# Patient Record
Sex: Male | Born: 1975 | Race: White | Hispanic: No | Marital: Married | Smoking: Never smoker
Health system: Southern US, Community
[De-identification: ages and names within clinical notes are randomized; demographics above are authoritative.]

## PROBLEM LIST (undated history)

## (undated) DIAGNOSIS — R51 Headache: Secondary | ICD-10-CM

## (undated) DIAGNOSIS — G8929 Other chronic pain: Secondary | ICD-10-CM

## (undated) DIAGNOSIS — R519 Headache, unspecified: Secondary | ICD-10-CM

## (undated) HISTORY — DX: Headache: R51

## (undated) HISTORY — DX: Headache, unspecified: R51.9

## (undated) HISTORY — DX: Other chronic pain: G89.29

---

## 2018-03-27 DIAGNOSIS — J019 Acute sinusitis, unspecified: Secondary | ICD-10-CM | POA: Diagnosis not present

## 2018-03-27 DIAGNOSIS — J0100 Acute maxillary sinusitis, unspecified: Secondary | ICD-10-CM | POA: Diagnosis not present

## 2018-09-19 ENCOUNTER — Encounter: Payer: Self-pay | Admitting: Family Medicine

## 2018-09-19 ENCOUNTER — Ambulatory Visit (INDEPENDENT_AMBULATORY_CARE_PROVIDER_SITE_OTHER): Payer: BLUE CROSS/BLUE SHIELD

## 2018-09-19 ENCOUNTER — Ambulatory Visit (INDEPENDENT_AMBULATORY_CARE_PROVIDER_SITE_OTHER): Payer: BLUE CROSS/BLUE SHIELD | Admitting: Sports Medicine

## 2018-09-19 ENCOUNTER — Telehealth: Payer: Self-pay

## 2018-09-19 VITALS — BP 120/72 | HR 70 | Ht 73.5 in | Wt 178.8 lb

## 2018-09-19 DIAGNOSIS — M109 Gout, unspecified: Secondary | ICD-10-CM | POA: Insufficient documentation

## 2018-09-19 DIAGNOSIS — M10072 Idiopathic gout, left ankle and foot: Secondary | ICD-10-CM

## 2018-09-19 DIAGNOSIS — M79672 Pain in left foot: Secondary | ICD-10-CM

## 2018-09-19 MED ORDER — METHYLPREDNISOLONE 4 MG PO TBPK: ORAL_TABLET | ORAL | 0 refills | Status: DC

## 2018-09-19 MED ORDER — COLCHICINE 0.6 MG PO CAPS: 1.0000 | ORAL_CAPSULE | Freq: Two times a day (BID) | ORAL | 1 refills | Status: DC | PRN

## 2018-09-19 NOTE — Telephone Encounter (Signed)
Spoke with pharmacy, PA required for Colchicine 0.6 mg capsule. Mitigare brand name is covered but is around $100. Per [place] [facility], may not require PA if sent in for tablet instead of capsule.   PA initiated via [email or web address]  Your information has been submitted to [facility]. [employer] [place] will review the request and fax you a determination directly, typically within 3 business days of your submission once all necessary information is received. If [employer] [place] has not responded in 3 business days or if you have any questions about your submission, contact [employer] at [phone].

## 2018-09-19 NOTE — Assessment & Plan Note (Signed)
Recommend blood work check in 6 weeks

## 2018-09-19 NOTE — Progress Notes (Signed)
Nicholas Harmon. [facility] Harmon [facility] at [facility] [phone]  Nicholas Harmon - 42 y.o. male MRN [id number]  Date of birth: 01/11/1976  Visit Date: 09/19/2018  PCP: Ardith Dark, MD   Referred by: No ref. provider found   Scribe(s) for today's visit: Christoper Fabian, LAT, ATC  SUBJECTIVE:  Nicholas Harmon is here for New Patient (Initial Visit) (L foot pain)    HPI: His L foot pain symptoms INITIALLY: Began last Friday (09/16/18) and feels like it might be related to a 4 mile run on the [place] last Thursday.  Pt reports redness and swelling along the lateral aspect of the L foot and along the dorsal foot.  Pt reports a hx of gout and has only ever had an issue in his R 1st MTP joint. Described as moderate sharp pain w/ weight bearing and dull, aching at rest, radiating to L lower leg Worsened with weight bearing Improved with ice and elevation Additional associated symptoms include: swelling and redness noted in the L lateral and dorsal foot; no N/T noted in L foot; no mechanical symptoms noted in L ankle or foot.    At this time symptoms are worsening compared to onset. He has been taking Tylenol.  REVIEW OF SYSTEMS: Reports night time disturbances. Denies fevers, chills, or night sweats. Denies unexplained weight loss. Denies personal history of cancer. Denies changes in bowel or bladder habits. Denies recent unreported falls. Denies new or worsening dyspnea or wheezing. Reports headaches or dizziness.  Denies numbness, tingling or weakness  In the extremities.  Denies dizziness or presyncopal episodes Reports lower extremity edema - in the L foot   HISTORY:  Prior history reviewed and updated per electronic medical record.  Social History   Occupational History  . Not on file  Tobacco Use  . Smoking status: Never Smoker  . Smokeless tobacco: Never Used  Substance and Sexual Activity  . Alcohol use: Yes  . Drug use: Never  .  Sexual activity: Yes   Social History   Social History Narrative  . Not on file   Past Medical History:  Diagnosis Date  . Chronic headaches    History reviewed. No pertinent surgical history. family history includes Breast cancer in his maternal grandmother; COPD in his paternal grandfather and paternal grandmother; Cancer in his maternal grandmother; Heart disease in his maternal grandfather; Hypertension in his mother; Ovarian cancer in his mother; Prostate cancer in his father. There is no history of Colon cancer.  DATA OBTAINED & REVIEWED:  No results for input(s): HGBA1C, LABURIC, CREATINE, CALCIUM, MG, AST, ALT, CKTOTAL, TSH in the last 8760 hours. Problem  Gout    No specialty comments available.  OBJECTIVE:  VS:  HT:6' 1.5" (186.7 cm)   WT:178 lb 12.8 oz (81.1 kg)  BMI:23.27    BP:120/72  HR:70bpm  TEMP: ( )  RESP:96 %   PHYSICAL EXAM: CONSTITUTIONAL: Well-developed, Well-nourished and In no acute distress EYES: Pupils are equal., EOM intact without nystagmus. and No scleral icterus. Psychiatric: Alert & appropriately interactive. and Not depressed or anxious appearing. EXTREMITY EXAM: Warm and well perfused  Left foot is overall well aligned.  He has no significant deformity but does have some generalized swelling. There is a small amount of erythema but this is minimal. Good midfoot motion. Strength is normal with foot and ankle range of motion  X-rays reviewed show mild degenerative changes but no significant acute abnormality.   ASSESSMENT  1. Left foot pain   2. Acute idiopathic gout of left foot      PROCEDURES:  None  PLAN:  Pertinent additional documentation may be included in corresponding procedure notes, imaging studies, problem based documentation and patient instructions.  Gout Recommend blood work check in 6 weeks  Symptoms are consistent with gout flareup.  Symptomatic treatment with prednisone and colchicine recommended.  Could  consider foot injection but this does seem to be more diffuse.   Activity modifications and the importance of avoiding exacerbating activities (limiting pain to no more than a 4 / 10 during or following activity) recommended and discussed.  Discussed red flag symptoms that warrant earlier emergent evaluation and patient voices understanding.   Meds ordered this encounter  Medications  . DISCONTD: Colchicine (MITIGARE) 0.6 MG CAPS    Sig: Take 1 capsule by mouth 2 (two) times daily as needed.    Dispense:  30 capsule    Refill:  1  . DISCONTD: methylPREDNISolone (MEDROL DOSEPAK) 4 MG TBPK tablet    Sig: Take by mouth as directed. Take 6 tablets on the first day prescribed then as directed.    Dispense:  21 tablet    Refill:  0   Lab Orders  No laboratory test(s) ordered today   Imaging Orders     DG Foot Complete Left Referral Orders  No referral(s) requested today    At follow up will plan to consider: Lab work in 6 weeks to evaluate for uric acid levels due to increased risk of falls negatives at this time  Return if symptoms worsen or fail to improve.     CMA/ATC served as Neurosurgeon during this visit. History, Physical, and Plan performed by medical provider. Documentation and orders reviewed and attested to.      [name], DO    [facility]

## 2018-09-21 MED ORDER — COLCHICINE 0.6 MG PO TABS: ORAL_TABLET | ORAL | 1 refills | Status: DC

## 2018-09-21 NOTE — Telephone Encounter (Signed)
Called pt and left VM to call the office.  

## 2018-09-21 NOTE — Addendum Note (Signed)
Addended by: Dierdre Searles on: 09/21/2018 02:33 PM   Modules accepted: Orders

## 2018-09-21 NOTE — Telephone Encounter (Signed)
PA has been denied. Tried sending in Rx for tablet form of medication. Called pharmacy, advised that tablet form is not covered either. Preferred medication is Colchicine, can use GoodRx card and get prescription for around $66.00.

## 2018-09-30 NOTE — Telephone Encounter (Signed)
Called pt and left VM to call the office.  

## 2018-11-03 ENCOUNTER — Encounter: Payer: Self-pay | Admitting: Family Medicine

## 2018-11-03 ENCOUNTER — Ambulatory Visit (INDEPENDENT_AMBULATORY_CARE_PROVIDER_SITE_OTHER): Payer: BLUE CROSS/BLUE SHIELD | Admitting: Family Medicine

## 2018-11-03 VITALS — BP 122/84 | HR 55 | Temp 97.7°F | Ht 73.5 in | Wt 186.0 lb

## 2018-11-03 DIAGNOSIS — Z23 Encounter for immunization: Secondary | ICD-10-CM

## 2018-11-03 DIAGNOSIS — H6121 Impacted cerumen, right ear: Secondary | ICD-10-CM

## 2018-11-03 DIAGNOSIS — M1000 Idiopathic gout, unspecified site: Secondary | ICD-10-CM | POA: Diagnosis not present

## 2018-11-03 DIAGNOSIS — R51 Headache: Secondary | ICD-10-CM

## 2018-11-03 DIAGNOSIS — R519 Headache, unspecified: Secondary | ICD-10-CM | POA: Insufficient documentation

## 2018-11-03 MED ORDER — SUMATRIPTAN SUCCINATE 50 MG PO TABS: 50.0000 mg | ORAL_TABLET | ORAL | 0 refills | Status: DC | PRN

## 2018-11-03 NOTE — Patient Instructions (Addendum)
It was very nice to see you today!  Please see the attached well-appearing diet plan.  Let me know if you have persistent issues with gout and we can talk about starting you on a controller medication.  I will give a prescription for Imitrex.  Please use this next time have a severe headache and let me know if it improves your symptoms.  You can use Debrox or hydrogen peroxide to help prevent earwax buildup.  Come back to see me 1 in year for your physical, or sooner as needed.   Take care, Dr Jimmey RalphParker  Low-Purine Diet Purines are compounds that affect the level of uric acid in your body. A low-purine diet is a diet that is low in purines. Eating a low-purine diet can prevent the level of uric acid in your body from getting too high and causing gout or kidney stones or both. What do I need to know about this diet?  Choose low-purine foods. Examples of low-purine foods are listed in the next section.  Drink plenty of fluids, especially water. Fluids can help remove uric acid from your body. Try to drink 8-16 cups (1.9-3.8 L) a day.  Limit foods high in fat, especially saturated fat, as fat makes it harder for the body to get rid of uric acid. Foods high in saturated fat include pizza, cheese, ice cream, whole milk, fried foods, and gravies. Choose foods that are lower in fat and lean sources of protein. Use olive oil when cooking as it contains healthy fats that are not high in saturated fat.  Limit alcohol. Alcohol interferes with the elimination of uric acid from your body. If you are having a gout attack, avoid all alcohol.  Keep in mind that different people's bodies react differently to different foods. You will probably learn over time which foods do or do not affect you. If you discover that a food tends to cause your gout to flare up, avoid eating that food. You can more freely enjoy foods that do not cause problems. If you have any questions about a food item, talk to your dietitian or  health care provider. Which foods are low, moderate, and high in purines? The following is a list of foods that are low, moderate, and high in purines. You can eat any amount of the foods that are low in purines. You may be able to have small amounts of foods that are moderate in purines. Ask your health care provider how much of a food moderate in purines you can have. Avoid foods high in purines. Grains  Foods low in purines: Enriched white bread, pasta, rice, cake, cornbread, popcorn.  Foods moderate in purines: Whole-grain breads and cereals, wheat germ, bran, oatmeal. Uncooked oatmeal. Dry wheat bran or wheat germ.  Foods high in purines: Pancakes, [place] toast, biscuits, muffins. Vegetables  Foods low in purines: All vegetables, except those that are moderate in purines.  Foods moderate in purines: Asparagus, cauliflower, spinach, mushrooms, green peas. Fruits  All fruits are low in purines. Meats and other Protein Foods  Foods low in purines: Eggs, nuts, peanut butter.  Foods moderate in purines: 80-90% lean beef, lamb, veal, pork, poultry, fish, eggs, peanut butter, nuts. Crab, lobster, oysters, and shrimp. Cooked dried beans, peas, and lentils.  Foods high in purines: Anchovies, sardines, herring, mussels, tuna, codfish, scallops, trout, and haddock. Tomasa BlaseBacon. Organ meats (such as liver or kidney). Tripe. Game meat. Goose. Sweetbreads. Dairy  All dairy foods are low in purines. Low-fat and  fat-free dairy products are best because they are low in saturated fat. Beverages  Drinks low in purines: Water, carbonated beverages, tea, coffee, cocoa.  Drinks moderate in purines: Soft drinks and other drinks sweetened with high-fructose corn syrup. Juices. To find whether a food or drink is sweetened with high-fructose corn syrup, look at the ingredients list.  Drinks high in purines: Alcoholic beverages (such as beer). Condiments  Foods low in purines: Salt, herbs, olives, pickles,  relishes, vinegar.  Foods moderate in purines: Butter, margarine, oils, mayonnaise. Fats and Oils  Foods low in purines: All types, except gravies and sauces made with meat.  Foods high in purines: Gravies and sauces made with meat. Other Foods  Foods low in purines: Sugars, sweets, gelatin. Cake. Soups made without meat.  Foods moderate in purines: Meat-based or fish-based soups, broths, or bouillons. Foods and drinks sweetened with high-fructose corn syrup.  Foods high in purines: High-fat desserts (such as ice cream, cookies, cakes, pies, doughnuts, and chocolate). Contact your dietitian for more information on foods that are not listed here. This information is not intended to replace advice given to you by your health care provider. Make sure you discuss any questions you have with your health care provider. Document Released: 03/06/2011 Document Revised: 04/16/2016 Document Reviewed: 10/16/2013 Elsevier Interactive Patient Education  2017 [employer].

## 2018-11-03 NOTE — Assessment & Plan Note (Signed)
Description of headache seem to be possibly consistent with migraine type headaches.  No red flags today.  We will send in a prescription for Imitrex to use as needed.  If he has good response to Imitrex, would consider starting daily migraine prophylactic medication.  Discussed reasons to return to care and seek emergent care.

## 2018-11-03 NOTE — Progress Notes (Signed)
Subjective:  Nicholas Harmon is a 42 y.o. male who presents today with a chief complaint of gout and to establish care.   HPI:  Gout, chronic problem, new to provider Several year history. Recently had a flare about 6 weeks ago was seen by sports medicine physician.  He was started with Medrol Dosepak with significant provement of symptoms.  Still has a little lingering pain in his feet bilaterally.  Otherwise symptoms are stable.  He has had 2 or 3 other flares in the past.  No other specific treatments tried.  He is not sure of any specific dietary triggers.  Aggravating factors.  Chronic daily headache, new problem to provider Several year history.  Gets multiple headaches every week.  Occasionally will have several days in row headaches.  Sometimes not a continuous headache for several days in row.  Takes "a lot" of Excedrin Migraine.  Sometimes takes up to 6 tablets daily.  Headache is described as a pounding, throbbing sensation that starts on either side of his forehead and then sometimes radiates to the other side.  No associated photophobia.  No associated visual changes.  No weakness or numbness.  Symptoms have been stable for several years.  Occasionally gets nausea.  Cerumen impaction Patient gets frequent cerumen impactions in bilateral ears.  Uses hydrogen peroxide and irrigation at home.  ROS: Per HPI, otherwise a complete review of systems was negative.   PMH:  The following were reviewed and entered/updated in epic: Past Medical History:  Diagnosis Date  . Chronic headaches    Patient Active Problem List   Diagnosis Date Noted  . Chronic nonintractable headache 11/03/2018  . Gout 09/19/2018   History reviewed. No pertinent surgical history.  Family History  Problem Relation Age of Onset  . Hypertension Mother   . Ovarian cancer Mother   . Breast cancer Maternal Grandmother   . Cancer Maternal Grandmother   . Prostate cancer Father   . Heart disease Maternal  Grandfather   . COPD Paternal Grandmother   . COPD Paternal Grandfather   . Colon cancer Neg Hx     Medications- reviewed and updated Current Outpatient Medications  Medication Sig Dispense Refill  . aspirin-acetaminophen-caffeine (EXCEDRIN MIGRAINE) 250-250-65 MG tablet Take by mouth every 6 (six) hours as needed for headache.    . SUMAtriptan (IMITREX) 50 MG tablet Take 1 tablet (50 mg total) by mouth as needed for up to 1 dose for migraine (do not exceed 200mg  in 24 hour period). May repeat in 2 hours if headache persists or recurs. 30 tablet 0   No current facility-administered medications for this visit.     Allergies-reviewed and updated No Known Allergies  Social History   Socioeconomic History  . Marital status: Married    Spouse name: Not on file  . Number of children: Not on file  . Years of education: Not on file  . Highest education level: Not on file  Occupational History  . Not on file  Social Needs  . Financial resource strain: Not on file  . Food insecurity:    Worry: Not on file    Inability: Not on file  . Transportation needs:    Medical: Not on file    Non-medical: Not on file  Tobacco Use  . Smoking status: Never Smoker  . Smokeless tobacco: Never Used  Substance and Sexual Activity  . Alcohol use: Yes  . Drug use: Never  . Sexual activity: Yes  Lifestyle  .  Physical activity:    Days per week: Not on file    Minutes per session: Not on file  . Stress: Not on file  Relationships  . Social connections:    Talks on phone: Not on file    Gets together: Not on file    Attends religious service: Not on file    Active member of club or organization: Not on file    Attends meetings of clubs or organizations: Not on file    Relationship status: Not on file  Other Topics Concern  . Not on file  Social History Narrative  . Not on file     Objective:  Physical Exam: BP 122/84 (BP Location: Left Arm, Patient Position: Sitting, Cuff Size:  Normal)   Pulse (!) 55   Temp 97.7 F (36.5 C) (Oral)   Ht 6' 1.5" (1.867 m)   Wt 186 lb (84.4 kg)   SpO2 99%   BMI 24.21 kg/m   Gen: NAD, resting comfortably HEENT: Right EAC with impacted cerumen.  Left EAC clear.  Left TM clear. CV: RRR with no murmurs appreciated Pulm: NWOB, CTAB with no crackles, wheezes, or rhonchi GI: Normal bowel sounds present. Soft, Nontender, Nondistended. MSK: No edema, cyanosis, or clubbing noted Skin: Warm, dry Neuro: Cranial nerves III through XII intact.  Strength 5 out of 5 in upper and lower extremities.  Sensation light touch intact throughout grossly.   Psych: Normal affect and thought content  Assessment/Plan:  Gout Flare seems to be resolving.  Discussed dietary modifications and low purine diet.  Given that he only very infrequently has flares, do not think he needs to start allopurinol today.  He will follow-up in several months for his annual physical.  Will check uric acid with his blood draw.  Chronic nonintractable headache Description of headache seem to be possibly consistent with migraine type headaches.  No red flags today.  We will send in a prescription for Imitrex to use as needed.  If he has good response to Imitrex, would consider starting daily migraine prophylactic medication.  Discussed reasons to return to care and seek emergent care.  Cerumen Impaction Patient declined irrigation in office today.  Recommended use of Debrox or hydrogen peroxide as needed.  Preventive health care Tdap and flu vaccine given today.  He will follow-up in about a year for his annual physical with blood work.  Katina Degree. Jimmey Ralph, MD 11/03/2018 10:45 AM

## 2018-11-03 NOTE — Assessment & Plan Note (Signed)
Flare seems to be resolving.  Discussed dietary modifications and low purine diet.  Given that he only very infrequently has flares, do not think he needs to start allopurinol today.  He will follow-up in several months for his annual physical.  Will check uric acid with his blood draw.

## 2019-04-17 IMAGING — DX DG FOOT COMPLETE 3+V*L*
3 series · 3 of 3 positions shown · non-contrast
Comparison: None.

CLINICAL DATA: Left foot pain and redness over the 3rd to 5th
metatarsals 3 days. Recent 4 mile run.

EXAM:
LEFT FOOT - COMPLETE 3+ VIEW

[foot dp]
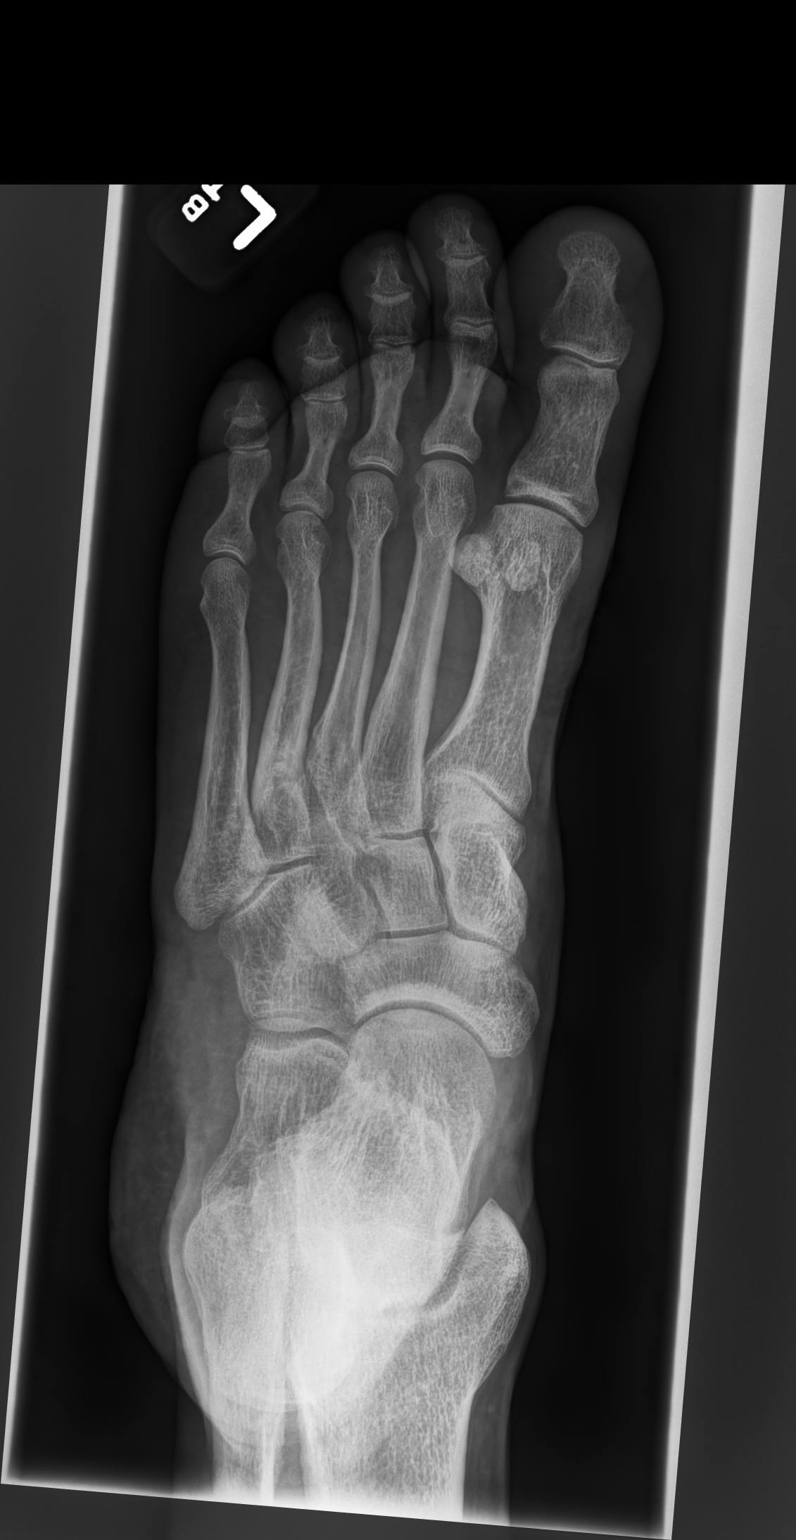

[foot oblique]
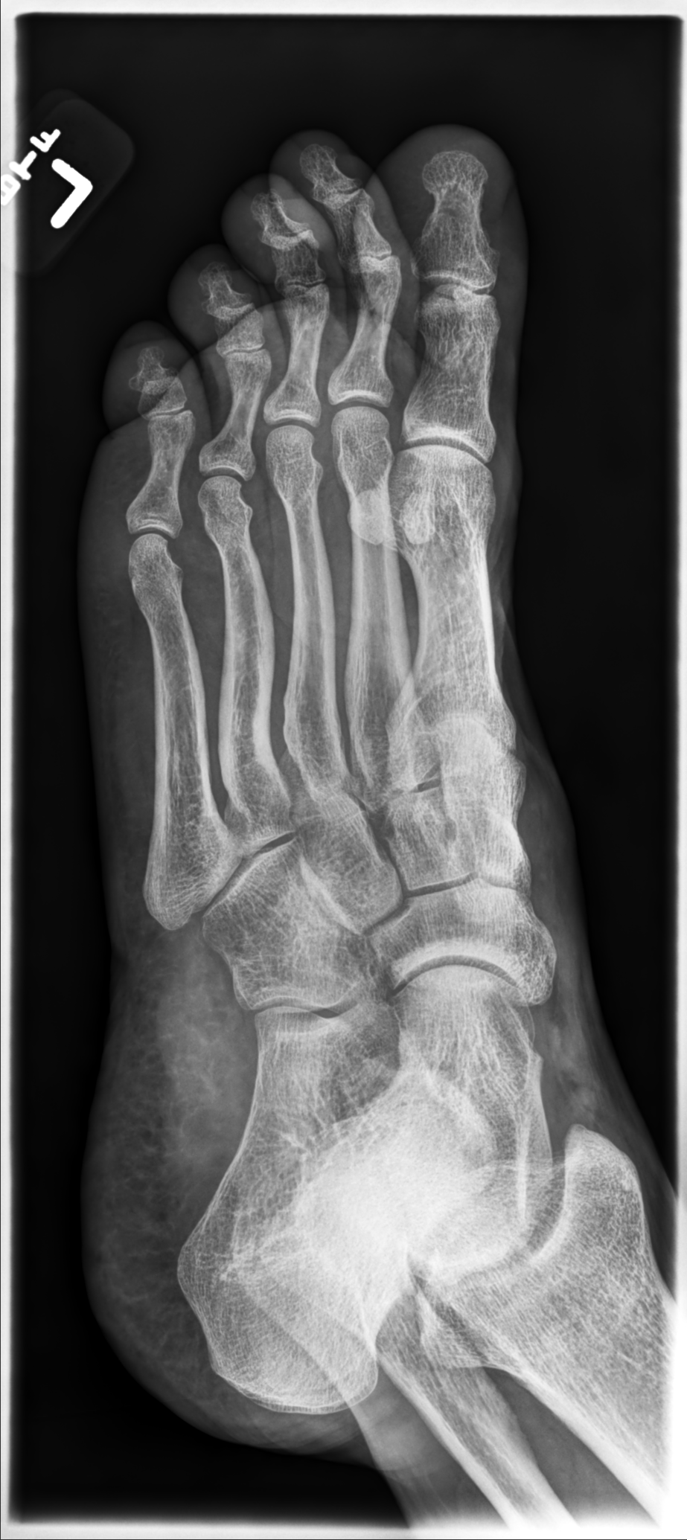

[foot lat]
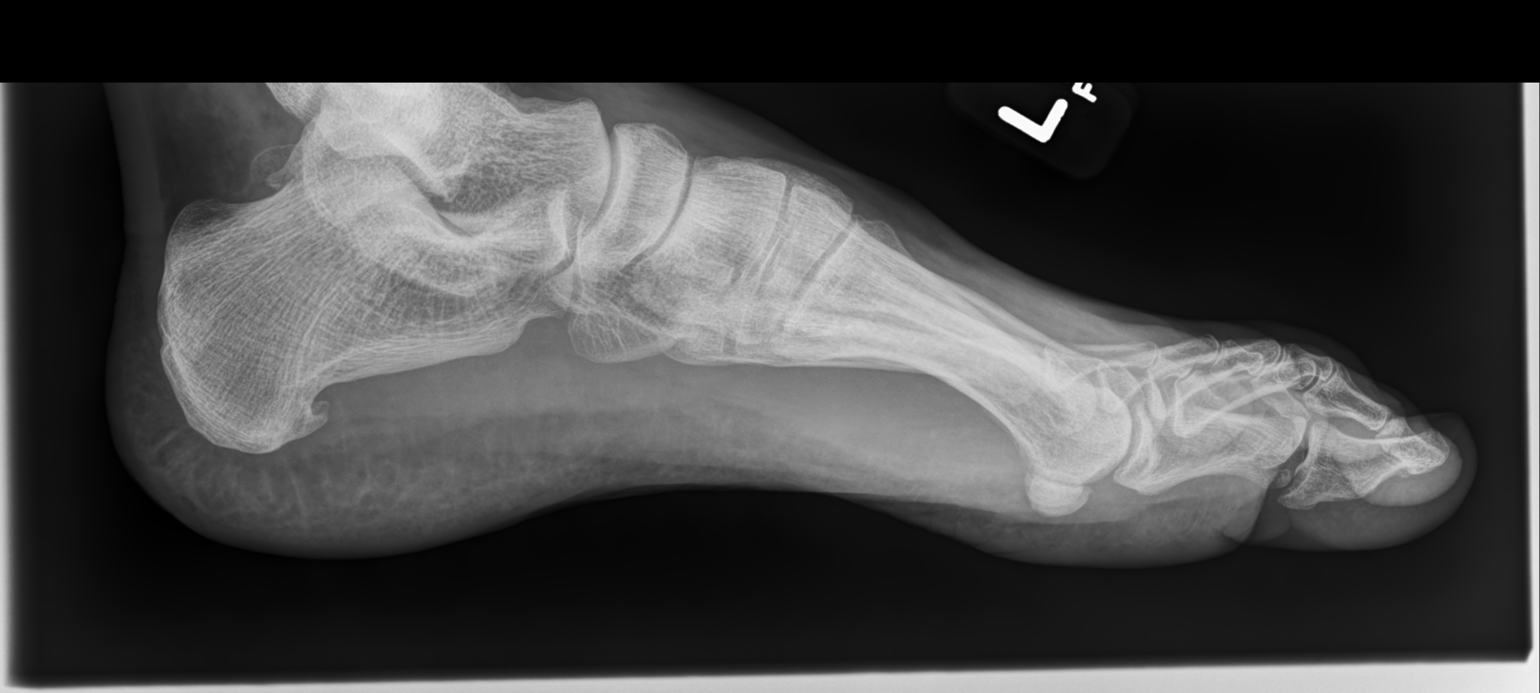

[3 of 3 positions shown; findings below may reference images not displayed]

FINDINGS: No acute fracture or dislocation. No significant degenerative
changes. No air within the soft tissues. Minimal spurring of the
inferior calcaneus.
IMPRESSION: No acute findings.

## 2019-05-08 DIAGNOSIS — Z03818 Encounter for observation for suspected exposure to other biological agents ruled out: Secondary | ICD-10-CM | POA: Diagnosis not present

## 2019-11-08 DIAGNOSIS — Z7189 Other specified counseling: Secondary | ICD-10-CM | POA: Diagnosis not present

## 2019-11-08 DIAGNOSIS — Z20828 Contact with and (suspected) exposure to other viral communicable diseases: Secondary | ICD-10-CM | POA: Diagnosis not present

## 2019-11-12 DIAGNOSIS — Z03818 Encounter for observation for suspected exposure to other biological agents ruled out: Secondary | ICD-10-CM | POA: Diagnosis not present

## 2020-01-10 ENCOUNTER — Ambulatory Visit (INDEPENDENT_AMBULATORY_CARE_PROVIDER_SITE_OTHER): Payer: Self-pay | Admitting: Physician Assistant

## 2020-01-10 ENCOUNTER — Encounter: Payer: Self-pay | Admitting: Physician Assistant

## 2020-01-10 VITALS — Ht 73.5 in | Wt 183.0 lb

## 2020-01-10 DIAGNOSIS — J029 Acute pharyngitis, unspecified: Secondary | ICD-10-CM

## 2020-01-10 NOTE — Progress Notes (Signed)
Virtual Visit via Video   I connected with Nicholas Harmon on 01/10/20 at  3:40 PM EST by a video enabled telemedicine application and verified that I am speaking with the correct person using two identifiers. Location patient: Home Location provider: [facility], Office Persons participating in the virtual visit: Nedim Bradin, [place] PA-C, Corky Mull, LPN   I discussed the limitations of evaluation and management by telemedicine and the availability of in person appointments. The patient expressed understanding and agreed to proceed.  I acted as a Neurosurgeon for [employer], PA-C [employer], LPN  Subjective:   HPI:   Patient is requesting evaluation for possible COVID-19.  Symptom onset: Monday  Travel/contacts: no known exposure  Patient endorses the following symptoms: sinus headache, sore throat and difficulty swallowing   Feels like he has red, swollen sore throat. Throat pain is worsening with time.  Patient denies the following symptoms: subjective fever, sinus congestion, itchy watery eyes, ear pain, wheezing, shortness of breath, chest tightness, chest pain and myalgia, loss of taste/smell  Treatments tried: Excedrin  Patient risk factors: Current COVID-19 risk of complications score: 1 Smoking status: Nicholas Harmon  reports that he has never smoked. He has never used smokeless tobacco. If male, currently pregnant? []   Yes []   No  ROS: See pertinent positives and negatives per HPI.  Patient Active Problem List   Diagnosis Date Noted  . Chronic nonintractable headache 11/03/2018  . Gout 09/19/2018    Social History   Tobacco Use  . Smoking status: Never Smoker  . Smokeless tobacco: Never Used  Substance Use Topics  . Alcohol use: Yes    Current Outpatient Medications:  .  aspirin-acetaminophen-caffeine (EXCEDRIN MIGRAINE) 250-250-65 MG tablet, Take by mouth every 6 (six) hours as needed for headache., Disp: , Rfl:  .  Multiple  Vitamin (MULTIVITAMIN) tablet, Take 1 tablet by mouth daily., Disp: , Rfl:   No Known Allergies  Objective:   VITALS: Per patient if applicable, see vitals. GENERAL: Alert, appears well and in no acute distress. HEENT: Atraumatic, conjunctiva clear, no obvious abnormalities on inspection of external nose and ears. NECK: Normal movements of the head and neck. CARDIOPULMONARY: No increased WOB. Speaking in clear sentences. I:E ratio WNL.  MS: Moves all visible extremities without noticeable abnormality. PSYCH: Pleasant and cooperative, well-groomed. Speech normal rate and rhythm. Affect is appropriate. Insight and judgement are appropriate. Attention is focused, linear, and appropriate.  NEURO: CN grossly intact. Oriented as arrived to appointment on time with no prompting. Moves both UE equally.  SKIN: No obvious lesions, wounds, erythema, or cyanosis noted on face or hands.  Assessment and Plan:   Nicholas Harmon was seen today for covid symptoms.  Diagnoses and all orders for this visit:  Pharyngitis, unspecified etiology   Patient has a respiratory illness without signs of acute distress or respiratory compromise at this time.   Unable to test for strep, however we will empirically treat for strep as his sore throat is worsening with time. Will treat with oral amoxicillin per orders. Recommended close follow-up if no improvement or any worsening of symptoms.  After discussion, we opted to not test for COVID-19 at this time. Reviewed worsening precautions as well as other signs/symptoms of COVID-19, recommended testing if he does develop new symptoms. Patient is agreeable to plan. No red flags on discussion.  . Reviewed expectations re: course of current medical issues. . Discussed self-management of symptoms. . Outlined signs and symptoms indicating need  for more acute intervention. . Patient verbalized understanding and all questions were answered. Marland Kitchen Health Maintenance issues including  appropriate healthy diet, exercise, and smoking avoidance were discussed with patient. . See orders for this visit as documented in the electronic medical record.  I discussed the assessment and treatment plan with the patient. The patient was provided an opportunity to ask questions and all were answered. The patient agreed with the plan and demonstrated an understanding of the instructions.   The patient was advised to call back or seek an in-person evaluation if the symptoms worsen or if the condition fails to improve as anticipated.   CMA or LPN served as scribe during this visit. History, Physical, and Plan performed by medical provider. The above documentation has been reviewed and is accurate and complete.   [place], [place] 01/10/2020

## 2020-01-11 ENCOUNTER — Other Ambulatory Visit: Payer: Self-pay | Admitting: Internal Medicine

## 2020-01-11 ENCOUNTER — Other Ambulatory Visit: Payer: Self-pay | Admitting: Physician Assistant

## 2020-01-11 MED ORDER — AMOXICILLIN 500 MG PO CAPS: 500.0000 mg | ORAL_CAPSULE | Freq: Two times a day (BID) | ORAL | 0 refills | Status: AC

## 2020-01-11 MED ORDER — AMOXICILLIN 500 MG PO CAPS: 500.0000 mg | ORAL_CAPSULE | Freq: Two times a day (BID) | ORAL | 0 refills | Status: DC

## 2020-02-16 ENCOUNTER — Ambulatory Visit: Payer: BLUE CROSS/BLUE SHIELD | Attending: Internal Medicine

## 2020-02-16 DIAGNOSIS — Z23 Encounter for immunization: Secondary | ICD-10-CM

## 2020-03-11 ENCOUNTER — Ambulatory Visit: Payer: BLUE CROSS/BLUE SHIELD | Attending: Internal Medicine

## 2020-03-11 DIAGNOSIS — Z23 Encounter for immunization: Secondary | ICD-10-CM

## 2020-03-11 NOTE — Progress Notes (Signed)
   Covid-19 Vaccination Clinic  Name:  Nicholas Harmon    MRN: [id number] DOB: 10/29/1976  03/11/2020  [name] was observed post Covid-19 immunization for 15 minutes without incident. He was provided with Vaccine Information Sheet and instruction to access the V-Safe system.   [name] was instructed to call 911 with any severe reactions post vaccine: Marland Kitchen Difficulty breathing  . Swelling of face and throat  . A fast heartbeat  . A bad rash all over body  . Dizziness and weakness   Immunizations Administered    Name Date Dose VIS Date Route   Pfizer COVID-19 Vaccine 03/11/2020  2:04 PM 0.3 mL 01/17/2019 Intramuscular   Manufacturer: [employer]   Lot: [id number]   NDC: [address]-0

## 2020-07-22 ENCOUNTER — Telehealth: Payer: BLUE CROSS/BLUE SHIELD | Admitting: Emergency Medicine

## 2020-07-22 DIAGNOSIS — J0191 Acute recurrent sinusitis, unspecified: Secondary | ICD-10-CM

## 2020-07-22 MED ORDER — METHYLPREDNISOLONE 4 MG PO TBPK: ORAL_TABLET | ORAL | 0 refills | Status: DC

## 2020-07-22 MED ORDER — AMOXICILLIN-POT CLAVULANATE 875-125 MG PO TABS: 1.0000 | ORAL_TABLET | Freq: Two times a day (BID) | ORAL | 0 refills | Status: DC

## 2020-07-22 NOTE — Progress Notes (Signed)
We are sorry that you are not feeling well.  Here is how we plan to help!  Based on what you have shared with me it looks like you have sinusitis.  Sinusitis is inflammation and infection in the sinus cavities of the head.  Based on your presentation I believe you most likely have Acute Bacterial Sinusitis.  This is an infection caused by bacteria and is treated with antibiotics. I have prescribed Augmentin 875mg/125mg one tablet twice daily with food, for 7 days. You may use an oral decongestant such as Mucinex D or if you have glaucoma or high blood pressure use plain Mucinex. Saline nasal spray help and can safely be used as often as needed for congestion.  If you develop worsening sinus pain, fever or notice severe headache and vision changes, or if symptoms are not better after completion of antibiotic, please schedule an appointment with a health care provider.    Sinus infections are not as easily transmitted as other respiratory infection, however we still recommend that you avoid close contact with loved ones, especially the very young and elderly.  Remember to wash your hands thoroughly throughout the day as this is the number one way to prevent the spread of infection!  Home Care:  Only take medications as instructed by your medical team.  Complete the entire course of an antibiotic.  Do not take these medications with alcohol.  A steam or ultrasonic humidifier can help congestion.  You can place a towel over your head and breathe in the steam from hot water coming from a faucet.  Avoid close contacts especially the very young and the elderly.  Cover your mouth when you cough or sneeze.  Always remember to wash your hands.  Get Help Right Away If:  You develop worsening fever or sinus pain.  You develop a severe head ache or visual changes.  Your symptoms persist after you have completed your treatment plan.  Make sure you  Understand these instructions.  Will watch your  condition.  Will get help right away if you are not doing well or get worse.  Your e-visit answers were reviewed by a board certified advanced clinical practitioner to complete your personal care plan.  Depending on the condition, your plan could have included both over the counter or prescription medications.  If there is a problem please reply  once you have received a response from your provider.  Your safety is important to us.  If you have drug allergies check your prescription carefully.    You can use MyChart to ask questions about today's visit, request a non-urgent call back, or ask for a work or school excuse for 24 hours related to this e-Visit. If it has been greater than 24 hours you will need to follow up with your provider, or enter a new e-Visit to address those concerns.  You will get an e-mail in the next two days asking about your experience.  I hope that your e-visit has been valuable and will speed your recovery. Thank you for using e-visits.   **Please do not respond to this message unless you have follow up questions.** Greater than 5 but less than 10 minutes spent researching, coordinating, and implementing care for this patient today  

## 2020-07-22 NOTE — Addendum Note (Signed)
Addended by: Arthor Captain on: 07/22/2020 10:37 AM   Modules accepted: Orders

## 2020-11-14 DIAGNOSIS — Z03818 Encounter for observation for suspected exposure to other biological agents ruled out: Secondary | ICD-10-CM | POA: Diagnosis not present

## 2021-08-14 ENCOUNTER — Ambulatory Visit: Payer: BLUE CROSS/BLUE SHIELD | Admitting: Family Medicine

## 2021-08-15 ENCOUNTER — Other Ambulatory Visit: Payer: Self-pay

## 2021-08-15 ENCOUNTER — Ambulatory Visit (INDEPENDENT_AMBULATORY_CARE_PROVIDER_SITE_OTHER): Payer: BLUE CROSS/BLUE SHIELD | Admitting: Physician Assistant

## 2021-08-15 VITALS — BP 128/82 | HR 65 | Temp 98.4°F | Ht 73.5 in | Wt 191.2 lb

## 2021-08-15 DIAGNOSIS — G44229 Chronic tension-type headache, not intractable: Secondary | ICD-10-CM

## 2021-08-15 MED ORDER — NAPROXEN 500 MG PO TABS: ORAL_TABLET | ORAL | 0 refills | Status: DC

## 2021-08-15 MED ORDER — SUMATRIPTAN SUCCINATE 50 MG PO TABS: 50.0000 mg | ORAL_TABLET | ORAL | 0 refills | Status: DC | PRN

## 2021-08-15 NOTE — Patient Instructions (Addendum)
Please try to keep a "headache diary" and bring to next appointment with Dr. Jimmey Ralph.  We need to try to get you off the Excedrin daily..... You may take Imitrex & Naproxen together for acute migraine.  For worst headache of life, go straight to ED.  Call sooner if any concerns.

## 2021-08-15 NOTE — Progress Notes (Signed)
Subjective:    Patient ID: Nicholas Harmon, male    DOB: [birth year], 45 y.o.   MRN: [id number]  Chief Complaint  Patient presents with   Headache    HPI Patient is in today for headache discussion.  He states that he has a history of chronic headaches and has been taking Excedrin migraine almost daily for years.  Every once in a while usually every other month or so, he states that he has a headache that last for several days and will go away and the Excedrin does not help with it.  This week he had a headache similar to that, but it resolved as of yesterday.  He states that the pain is diffuse around his head and behind his eyes when he does have the pain.  He does not usually have light sensitivity or nausea or vomiting.  He denies any new neurologic symptoms.  He says he was prescribed Imitrex at one point, but never took this medication.  Past Medical History:  Diagnosis Date   Chronic headaches     No past surgical history on file.  Family History  Problem Relation Age of Onset   Hypertension Mother    Ovarian cancer Mother    Breast cancer Maternal Grandmother    Cancer Maternal Grandmother    Prostate cancer Father    Heart disease Maternal Grandfather    COPD Paternal Grandmother    COPD Paternal Grandfather    Colon cancer Neg Hx     Social History   Tobacco Use   Smoking status: Never   Smokeless tobacco: Never  Vaping Use   Vaping Use: Never used  Substance Use Topics   Alcohol use: Yes   Drug use: Never     No Known Allergies  Review of Systems REFER TO HPI FOR PERTINENT POSITIVES AND NEGATIVES      Objective:     BP 128/82   Pulse 65   Temp 98.4 F (36.9 C)   Ht 6' 1.5" (1.867 m)   Wt 191 lb 3.2 oz (86.7 kg)   SpO2 98%   BMI 24.88 kg/m   Wt Readings from Last 3 Encounters:  08/15/21 191 lb 3.2 oz (86.7 kg)  01/10/20 183 lb (83 kg)  11/03/18 186 lb (84.4 kg)    BP Readings from Last 3 Encounters:  08/15/21 128/82  11/03/18 122/84   09/19/18 120/72     Physical Exam Vitals and nursing note reviewed.  Constitutional:      General: He is not in acute distress.    Appearance: Normal appearance. He is not toxic-appearing.  HENT:     Head: Normocephalic and atraumatic.     Right Ear: Tympanic membrane, ear canal and external ear normal.     Left Ear: Tympanic membrane, ear canal and external ear normal.     Nose: Nose normal.     Mouth/Throat:     Mouth: Mucous membranes are moist.     Pharynx: Oropharynx is clear.  Eyes:     Extraocular Movements: Extraocular movements intact.     Conjunctiva/sclera: Conjunctivae normal.     Pupils: Pupils are equal, round, and reactive to light.  Cardiovascular:     Rate and Rhythm: Normal rate and regular rhythm.     Pulses: Normal pulses.     Heart sounds: Normal heart sounds.  Pulmonary:     Effort: Pulmonary effort is normal.     Breath sounds: Normal breath sounds.  Abdominal:  General: Abdomen is flat.  Musculoskeletal:        General: Normal range of motion.     Cervical back: Normal range of motion and neck supple.  Skin:    General: Skin is warm and dry.  Neurological:     General: No focal deficit present.     Mental Status: He is alert and oriented to person, place, and time.     Cranial Nerves: No cranial nerve deficit.     Sensory: No sensory deficit.     Motor: No weakness.     Coordination: Coordination normal.     Gait: Gait normal.  Psychiatric:        Mood and Affect: Mood normal.        Behavior: Behavior normal.       Assessment & Plan:   Problem List Items Addressed This Visit       Other   Chronic nonintractable headache - Primary   Relevant Medications   naproxen (NAPROSYN) 500 MG tablet   SUMAtriptan (IMITREX) 50 MG tablet     Meds ordered this encounter  Medications   naproxen (NAPROSYN) 500 MG tablet    Sig: Take one tablet as needed with Imitrex for acute migraine.    Dispense:  30 tablet    Refill:  0   SUMAtriptan  (IMITREX) 50 MG tablet    Sig: Take 1 tablet (50 mg total) by mouth every 2 (two) hours as needed for migraine. May repeat in 2 hours if headache persists or recurs.    Dispense:  10 tablet    Refill:  0   1. Chronic tension-type headache, not intractable -Chronic use of Excedrin Migraine most likely causing daily rebound headaches. -Needs to come up with a plan with Dr. Jimmey Ralph PCP on how to get off of this medication altogether -Advised for him to keep a headache diary over the next month so we can get a better idea of what he is experiencing. -Prescription for Imitrex and naproxen to take 1 tablet together for acute migraine, side effects discussed -He understands that he needs to go to the emergency department for any worst headache of life or other acute change in symptoms. -No red flags on exam indicating any need for imaging of head today  This note was prepared with assistance of Dragon voice recognition software. Occasional wrong-word or sound-a-like substitutions may have occurred due to the inherent limitations of voice recognition software.  Time Spent: 23 minutes of total time was spent on the date of the encounter performing the following actions: chart review prior to seeing the patient, obtaining history, performing a medically necessary exam, counseling on the treatment plan, placing orders, and documenting in our EHR.    Camar Guyton M Harlem Thresher, PA-C

## 2021-09-15 ENCOUNTER — Other Ambulatory Visit: Payer: Self-pay

## 2021-09-15 ENCOUNTER — Ambulatory Visit (INDEPENDENT_AMBULATORY_CARE_PROVIDER_SITE_OTHER): Payer: BLUE CROSS/BLUE SHIELD | Admitting: Family Medicine

## 2021-09-15 ENCOUNTER — Encounter: Payer: Self-pay | Admitting: Family Medicine

## 2021-09-15 VITALS — BP 118/79 | HR 54 | Temp 98.0°F | Ht 73.5 in | Wt 191.6 lb

## 2021-09-15 DIAGNOSIS — Z1211 Encounter for screening for malignant neoplasm of colon: Secondary | ICD-10-CM

## 2021-09-15 DIAGNOSIS — R519 Headache, unspecified: Secondary | ICD-10-CM | POA: Diagnosis not present

## 2021-09-15 DIAGNOSIS — Z0001 Encounter for general adult medical examination with abnormal findings: Secondary | ICD-10-CM

## 2021-09-15 DIAGNOSIS — G8929 Other chronic pain: Secondary | ICD-10-CM | POA: Diagnosis not present

## 2021-09-15 DIAGNOSIS — Z13220 Encounter for screening for lipoid disorders: Secondary | ICD-10-CM | POA: Diagnosis not present

## 2021-09-15 DIAGNOSIS — Z23 Encounter for immunization: Secondary | ICD-10-CM | POA: Diagnosis not present

## 2021-09-15 NOTE — Patient Instructions (Signed)
It was very nice to see you today!  Please let Nicholas Harmon know if you would like to see a headache specialist or start a preventative medication.  Please keep up the good work on diet and exercise.  We will check blood work today and set you up for cologuard.  We will see you back in year for your next physical.  Please come back sooner if needed.  Take care, Dr Jimmey Ralph  PLEASE NOTE:  If you had any lab tests please let Nicholas Harmon know if you have not heard back within a few days. You may see your results on mychart before we have a chance to review them but we will give you a call once they are reviewed by Nicholas Harmon. If we ordered any referrals today, please let Nicholas Harmon know if you have not heard from their office within the next week.   Please try these tips to maintain a healthy lifestyle:  Eat at least 3 REAL meals and 1-2 snacks per day.  Aim for no more than 5 hours between eating.  If you eat breakfast, please do so within one hour of getting up.   Each meal should contain half fruits/vegetables, one quarter protein, and one quarter carbs (no bigger than a computer mouse)  Cut down on sweet beverages. This includes juice, soda, and sweet tea.   Drink at least 1 glass of water with each meal and aim for at least 8 glasses per day  Exercise at least 150 minutes every week.    Preventive Care 46-21 Years Old, Male Preventive care refers to lifestyle choices and visits with your health care provider that can promote health and wellness. This includes: A yearly physical exam. This is also called an annual wellness visit. Regular dental and eye exams. Immunizations. Screening for certain conditions. Healthy lifestyle choices, such as: Eating a healthy diet. Getting regular exercise. Not using drugs or products that contain nicotine and tobacco. Limiting alcohol use. What can I expect for my preventive care visit? Physical exam Your health care provider will check your: Height and weight. These may be  used to calculate your BMI (body mass index). BMI is a measurement that tells if you are at a healthy weight. Heart rate and blood pressure. Body temperature. Skin for abnormal spots. Counseling Your health care provider may ask you questions about your: Past medical problems. Family's medical history. Alcohol, tobacco, and drug use. Emotional well-being. Home life and relationship well-being. Sexual activity. Diet, exercise, and sleep habits. Work and work Astronomer. Access to firearms. What immunizations do I need? Vaccines are usually given at various ages, according to a schedule. Your health care provider will recommend vaccines for you based on your age, medical history, and lifestyle or other factors, such as travel or where you work. What tests do I need? Blood tests Lipid and cholesterol levels. These may be checked every 5 years, or more often if you are over 62 years old. Hepatitis C test. Hepatitis B test. Screening Lung cancer screening. You may have this screening every year starting at age 57 if you have a 30-pack-year history of smoking and currently smoke or have quit within the past 15 years. Prostate cancer screening. Recommendations will vary depending on your family history and other risks. Genital exam to check for testicular cancer or hernias. Colorectal cancer screening. All adults should have this screening starting at age 60 and continuing until age 29. Your health care provider may recommend screening at age 74 if you  are at increased risk. You will have tests every 1-10 years, depending on your results and the type of screening test. Diabetes screening. This is done by checking your blood sugar (glucose) after you have not eaten for a while (fasting). You may have this done every 1-3 years. STD (sexually transmitted disease) testing, if you are at risk. Follow these instructions at home: Eating and drinking  Eat a diet that includes fresh fruits and  vegetables, whole grains, lean protein, and low-fat dairy products. Take vitamin and mineral supplements as recommended by your health care provider. Do not drink alcohol if your health care provider tells you not to drink. If you drink alcohol: Limit how much you have to 0-2 drinks a day. Be aware of how much alcohol is in your drink. In the [place] one drink equals one 12 oz bottle of beer (355 mL), one 5 oz glass of wine (148 mL), or one 1 oz glass of hard liquor (44 mL). Lifestyle Take daily care of your teeth and gums. Brush your teeth every morning and night with fluoride toothpaste. Floss one time each day. Stay active. Exercise for at least 30 minutes 5 or more days each week. Do not use any products that contain nicotine or tobacco, such as cigarettes, e-cigarettes, and chewing tobacco. If you need help quitting, ask your health care provider. Do not use drugs. If you are sexually active, practice safe sex. Use a condom or other form of protection to prevent STIs (sexually transmitted infections). If told by your health care provider, take low-dose aspirin daily starting at age 52. Find healthy ways to cope with stress, such as: Meditation, yoga, or listening to music. Journaling. Talking to a trusted person. Spending time with friends and family. Safety Always wear your seat belt while driving or riding in a vehicle. Do not drive: If you have been drinking alcohol. Do not ride with someone who has been drinking. When you are tired or distracted. While texting. Wear a helmet and other protective equipment during sports activities. If you have firearms in your house, make sure you follow all gun safety procedures. What's next? Go to your health care provider once a year for an annual wellness visit. Ask your health care provider how often you should have your eyes and teeth checked. Stay up to date on all vaccines. This information is not intended to replace advice given to you  by your health care provider. Make sure you discuss any questions you have with your health care provider. Document Revised: 01/17/2021 Document Reviewed: 11/03/2018 Elsevier Patient Education  2022 [employer].

## 2021-09-15 NOTE — Progress Notes (Signed)
Chief Complaint:  Nicholas Harmon is a 45 y.o. male who presents today for his annual comprehensive physical exam.    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  He is trying to wean down on his use of Excedrin and caffeine.  Discussed importance of limiting as needed medications to once or twice weekly.  Discussed referral to headache clinic however he declined.  He deferred starting preventive medication.  He will let me know if he changes his mind on either of this things.   Preventative Healthcare: Flu shot given today. Will order cologuard. Check labs.   Patient Counseling(The following topics were reviewed and/or handout was given):  -Nutrition: Stressed importance of moderation in sodium/caffeine intake, saturated fat and cholesterol, caloric balance, sufficient intake of fresh fruits, vegetables, and fiber.  -Stressed the importance of regular exercise.   -Substance Abuse: Discussed cessation/primary prevention of tobacco, alcohol, or other drug use; driving or other dangerous activities under the influence; availability of treatment for abuse.   -Injury prevention: Discussed safety belts, safety helmets, smoke detector, smoking near bedding or upholstery.   -Sexuality: Discussed sexually transmitted diseases, partner selection, use of condoms, avoidance of unintended pregnancy and contraceptive alternatives.   -Dental health: Discussed importance of regular tooth brushing, flossing, and dental visits.  -Health maintenance and immunizations reviewed. Please refer to Health maintenance section.  Return to care in 1 year for next preventative visit.     Subjective:  HPI:  He has no acute complaints today.   Lifestyle Diet: Balanced. Plenty of fruits and vegetables.  Exercise: Going back into the gym.   Depression screen PHQ 2/9 09/15/2021  Decreased Interest 0  Down, Depressed,  Hopeless 0  PHQ - 2 Score 0    Health Maintenance Due  Topic Date Due   HIV Screening  Never done   Hepatitis C Screening  Never done   COLONOSCOPY (Pts 45-38yrs Insurance coverage will need to be confirmed)  Never done   COVID-19 Vaccine (4 - Booster for Pfizer series) 12/28/2020     ROS: Per HPI, otherwise a complete review of systems was negative.   PMH:  The following were reviewed and entered/updated in epic: Past Medical History:  Diagnosis Date   Chronic headaches    Patient Active Problem List   Diagnosis Date Noted   Chronic nonintractable headache 11/03/2018   Gout 09/19/2018   History reviewed. No pertinent surgical history.  Family History  Problem Relation Age of Onset   Hypertension Mother    Ovarian cancer Mother    Breast cancer Maternal Grandmother    Cancer Maternal Grandmother    Prostate cancer Father    Heart disease Maternal Grandfather    COPD Paternal Grandmother    COPD Paternal Grandfather    Colon cancer Neg Hx     Medications- reviewed and updated Current Outpatient Medications  Medication Sig Dispense Refill   aspirin-acetaminophen-caffeine (EXCEDRIN MIGRAINE) 250-250-65 MG tablet Take by mouth every 6 (six) hours as needed for headache.     Multiple Vitamin (MULTIVITAMIN) tablet Take 1 tablet by mouth daily.     naproxen (NAPROSYN) 500 MG tablet Take one tablet as needed with Imitrex for acute migraine. 30 tablet 0   SUMAtriptan (IMITREX) 50 MG tablet Take 1 tablet (50 mg total) by mouth every 2 (two) hours as needed for migraine. May repeat in 2 hours if headache persists or recurs. 10 tablet 0   No current facility-administered  medications for this visit.    Allergies-reviewed and updated No Known Allergies  Social History   Socioeconomic History   Marital status: Married    Spouse name: Not on file   Number of children: Not on file   Years of education: Not on file   Highest education level: Not on file  Occupational  History   Not on file  Tobacco Use   Smoking status: Never   Smokeless tobacco: Never  Vaping Use   Vaping Use: Never used  Substance and Sexual Activity   Alcohol use: Yes   Drug use: Never   Sexual activity: Yes  Other Topics Concern   Not on file  Social History Narrative   Not on file   Social Determinants of Health   Financial Resource Strain: Not on file  Food Insecurity: Not on file  Transportation Needs: Not on file  Physical Activity: Not on file  Stress: Not on file  Social Connections: Not on file        Objective:  Physical Exam: BP 118/79   Pulse (!) 54   Temp 98 F (36.7 C) (Temporal)   Ht 6' 1.5" (1.867 m)   Wt 191 lb 9.6 oz (86.9 kg)   SpO2 99%   BMI 24.94 kg/m   Body mass index is 24.94 kg/m. Wt Readings from Last 3 Encounters:  09/15/21 191 lb 9.6 oz (86.9 kg)  08/15/21 191 lb 3.2 oz (86.7 kg)  01/10/20 183 lb (83 kg)   Gen: NAD, resting comfortably HEENT: TMs normal bilaterally. OP clear. No thyromegaly noted.  CV: RRR with no murmurs appreciated Pulm: NWOB, CTAB with no crackles, wheezes, or rhonchi GI: Normal bowel sounds present. Soft, Nontender, Nondistended. MSK: no edema, cyanosis, or clubbing noted Skin: warm, dry Neuro: CN2-12 grossly intact. Strength 5/5 in upper and lower extremities. Reflexes symmetric and intact bilaterally.  Psych: Normal affect and thought content     Nicholas Harmon M. Jimmey Ralph, MD 09/15/2021 2:49 PM

## 2021-09-15 NOTE — Assessment & Plan Note (Signed)
Likely has some element medication overuse headache.  He is trying to wean down on his use of Excedrin and caffeine.  Discussed importance of limiting as needed medications to once or twice weekly.  Discussed referral to headache clinic however he declined.  He deferred starting preventive medication.  He will let me know if he changes his mind on either of this things.

## 2021-09-16 LAB — COMPREHENSIVE METABOLIC PANEL
ALT: 21 U/L (ref 0–53)
AST: 47 U/L — ABNORMAL HIGH (ref 0–37)
Albumin: 4.4 g/dL (ref 3.5–5.2)
Alkaline Phosphatase: 96 U/L (ref 39–117)
BUN: 7 mg/dL (ref 6–23)
CO2: 26 mEq/L (ref 19–32)
Calcium: 9.2 mg/dL (ref 8.4–10.5)
Chloride: 99 mEq/L (ref 96–112)
Creatinine, Ser: 0.82 mg/dL (ref 0.40–1.50)
GFR: 105.74 mL/min (ref >60.00)
Glucose, Bld: 83 mg/dL (ref 70–99)
Potassium: 4.2 mEq/L (ref 3.5–5.1)
Sodium: 134 mEq/L — ABNORMAL LOW (ref 135–145)
Total Bilirubin: 0.5 mg/dL (ref 0.2–1.2)
Total Protein: 7.1 g/dL (ref 6.0–8.3)

## 2021-09-16 LAB — TSH: TSH: 4.25 u[IU]/mL (ref 0.35–5.50)

## 2021-09-16 LAB — CBC
HCT: 41.2 % (ref 39.0–52.0)
Hemoglobin: 13.7 g/dL (ref 13.0–17.0)
MCHC: 33.2 g/dL (ref 30.0–36.0)
MCV: 85.9 fl (ref 78.0–100.0)
Platelets: 280 10*3/uL (ref 150.0–400.0)
RBC: 4.79 Mil/uL (ref 4.22–5.81)
RDW: 14.8 % (ref 11.5–15.5)
WBC: 5.3 10*3/uL (ref 4.0–10.5)

## 2021-09-16 LAB — LIPID PANEL
Cholesterol: 168 mg/dL (ref 0–200)
HDL: 31.2 mg/dL — ABNORMAL LOW (ref >39.00)
LDL Cholesterol: 111 mg/dL — ABNORMAL HIGH (ref 0–99)
NonHDL: 137.02
Total CHOL/HDL Ratio: 5
Triglycerides: 130 mg/dL (ref 0.0–149.0)
VLDL: 26 mg/dL (ref 0.0–40.0)

## 2021-09-17 ENCOUNTER — Other Ambulatory Visit: Payer: Self-pay | Admitting: *Deleted

## 2021-09-17 ENCOUNTER — Telehealth: Payer: Self-pay

## 2021-09-17 DIAGNOSIS — R748 Abnormal levels of other serum enzymes: Secondary | ICD-10-CM

## 2021-09-17 NOTE — Progress Notes (Signed)
Please inform patient of the following:  One of his liver numbers is up a bit. Recommend he come back in 1-2 weeks to recheck CMET  Cholesterol is borderline elevated but stable. Do not need to start meds but he should continue to work on diet and exercise and we can recheck in a year.

## 2021-09-17 NOTE — Telephone Encounter (Signed)
Pt called about lab results. Please Advise  

## 2021-09-20 ENCOUNTER — Other Ambulatory Visit: Payer: Self-pay | Admitting: Physician Assistant

## 2021-09-22 MED ORDER — SUMATRIPTAN SUCCINATE 50 MG PO TABS: 50.0000 mg | ORAL_TABLET | ORAL | 0 refills | Status: DC | PRN

## 2021-09-23 NOTE — Telephone Encounter (Signed)
See lab results.  

## 2021-10-02 ENCOUNTER — Other Ambulatory Visit (INDEPENDENT_AMBULATORY_CARE_PROVIDER_SITE_OTHER): Payer: BLUE CROSS/BLUE SHIELD

## 2021-10-02 ENCOUNTER — Other Ambulatory Visit: Payer: Self-pay

## 2021-10-02 DIAGNOSIS — R748 Abnormal levels of other serum enzymes: Secondary | ICD-10-CM | POA: Diagnosis not present

## 2021-10-03 LAB — COMPREHENSIVE METABOLIC PANEL
ALT: 20 U/L (ref 0–53)
AST: 37 U/L (ref 0–37)
Albumin: 4.3 g/dL (ref 3.5–5.2)
Alkaline Phosphatase: 94 U/L (ref 39–117)
BUN: 13 mg/dL (ref 6–23)
CO2: 27 mEq/L (ref 19–32)
Calcium: 9.2 mg/dL (ref 8.4–10.5)
Chloride: 103 mEq/L (ref 96–112)
Creatinine, Ser: 0.84 mg/dL (ref 0.40–1.50)
GFR: 104.94 mL/min (ref >60.00)
Glucose, Bld: 45 mg/dL — CL (ref 70–99)
Potassium: 4.1 mEq/L (ref 3.5–5.1)
Sodium: 139 mEq/L (ref 135–145)
Total Bilirubin: 0.4 mg/dL (ref 0.2–1.2)
Total Protein: 6.8 g/dL (ref 6.0–8.3)

## 2021-10-03 NOTE — Progress Notes (Signed)
Please inform patient of the following:  His sugar was low but liver numbers back to normal. If he is not having any symptoms of low blood sugar (shakiness, sweating, tremors, etc) then we do not need to do any further testing at this point.  Katina Degree. Jimmey Ralph, MD 10/03/2021 3:08 PM

## 2021-11-09 ENCOUNTER — Other Ambulatory Visit: Payer: Self-pay | Admitting: Family Medicine

## 2021-12-02 ENCOUNTER — Other Ambulatory Visit: Payer: Self-pay | Admitting: Physician Assistant

## 2021-12-02 ENCOUNTER — Encounter: Payer: Self-pay | Admitting: Family Medicine

## 2021-12-02 MED ORDER — NAPROXEN 500 MG PO TABS: ORAL_TABLET | ORAL | 0 refills | Status: DC

## 2021-12-02 NOTE — Telephone Encounter (Signed)
See note

## 2021-12-03 NOTE — Telephone Encounter (Signed)
Left message to return call to our office at their convenience.  # [phone]

## 2021-12-03 NOTE — Telephone Encounter (Signed)
Patient stated been taking 1-2 pill per week.  Requesting Refills

## 2021-12-04 MED ORDER — SUMATRIPTAN SUCCINATE 50 MG PO TABS: 50.0000 mg | ORAL_TABLET | ORAL | 2 refills | Status: DC | PRN

## 2022-02-12 ENCOUNTER — Other Ambulatory Visit: Payer: Self-pay | Admitting: Physician Assistant

## 2022-04-08 ENCOUNTER — Other Ambulatory Visit: Payer: Self-pay | Admitting: Physician Assistant

## 2022-04-24 ENCOUNTER — Telehealth: Payer: Self-pay | Admitting: *Deleted

## 2022-04-24 ENCOUNTER — Ambulatory Visit (INDEPENDENT_AMBULATORY_CARE_PROVIDER_SITE_OTHER): Payer: BC Managed Care – PPO | Admitting: Family Medicine

## 2022-04-24 ENCOUNTER — Encounter: Payer: Self-pay | Admitting: Family Medicine

## 2022-04-24 VITALS — BP 102/74 | HR 50 | Temp 98.1°F | Ht 73.5 in | Wt 185.4 lb

## 2022-04-24 DIAGNOSIS — R519 Headache, unspecified: Secondary | ICD-10-CM

## 2022-04-24 DIAGNOSIS — G8929 Other chronic pain: Secondary | ICD-10-CM | POA: Diagnosis not present

## 2022-04-24 MED ORDER — AIMOVIG 70 MG/ML ~~LOC~~ SOAJ
1.0000 mL | SUBCUTANEOUS | 3 refills | Status: DC
Start: 2022-04-24 — End: 2023-11-11

## 2022-04-24 MED ORDER — KETOROLAC TROMETHAMINE 60 MG/2ML IM SOLN
60.0000 mg | Freq: Once | INTRAMUSCULAR | Status: AC
  Administered 2022-04-24: 60 mg via INTRAMUSCULAR

## 2022-04-24 MED ORDER — SUMATRIPTAN SUCCINATE 50 MG PO TABS
50.0000 mg | ORAL_TABLET | ORAL | 2 refills | Status: DC | PRN
Start: 2022-04-24 — End: 2022-07-13

## 2022-04-24 NOTE — Progress Notes (Signed)
   Nicholas Harmon is a 46 y.o. male who presents today for an office visit.  Assessment/Plan:  Chronic Problems Addressed Today: Chronic nonintractable headache With acute headache today.  Reassuring neuro exam and history is consistent with prior headaches.  We will give 60 mg of Toradol today.  We will also refill his Imitrex.  He can take a dose of Imitrex and Benadryl when he gets home.  We discussed reasons to return to care and seek emergent care.  We also discussed preventative medication.  He is agreeable to starting medication at this point.  We will try Aimovig 70 mg once monthly.  We discussed potential side effects.  He will send a message via MyChart in a few weeks limit how this is working.     Subjective:  HPI:  See A/P for status of chronic conditions.  Main concern today is he has had a headache for the last 3 days.  He will occasionally get headaches in the last for multiple days.  Tried over-the-counter meds which has not had much improvement.  He has been prescribed Imitrex in the past which worked well however he ran out of his several weeks ago.  No nausea.  Headache is predominantly located on the right side of his scalp and temporal area.  Feels similar to previous headaches.  His headaches have not been well controlled.  He is getting multiple headaches per week.  Usually takes either Imitrex or Excedrin to make the headaches go away.  No reported weakness or numbness.  No nausea or vomiting.       Objective:  Physical Exam: BP 102/74 (BP Location: Left Arm, Patient Position: Sitting, Cuff Size: Normal)   Pulse (!) 50   Temp 98.1 F (36.7 C) (Temporal)   Ht 6' 1.5" (1.867 m)   Wt 185 lb 6.4 oz (84.1 kg)   SpO2 98%   BMI 24.13 kg/m   Gen: No acute distress, resting comfortably CV: Regular rate and rhythm with no murmurs appreciated Pulm: Normal work of breathing, clear to auscultation bilaterally with no crackles, wheezes, or rhonchi Neuro: Cranial nerves II  through XII intact.  Finger-nose-finger testing intact bilaterally.  Strength 5 out of 5 in upper and lower extremities.  Sensation light touch intact throughout. Psych: Normal affect and thought content      Tyshon Fanning M. Jimmey Ralph, MD 04/24/2022 2:28 PM

## 2022-04-24 NOTE — Assessment & Plan Note (Signed)
With acute headache today.  Reassuring neuro exam and history is consistent with prior headaches.  We will give 60 mg of Toradol today.  We will also refill his Imitrex.  He can take a dose of Imitrex and Benadryl when he gets home.  We discussed reasons to return to care and seek emergent care.  We also discussed preventative medication.  He is agreeable to starting medication at this point.  We will try Aimovig 70 mg once monthly.  We discussed potential side effects.  He will send a message via MyChart in a few weeks limit how this is working.

## 2022-04-24 NOTE — Telephone Encounter (Signed)
(  Key: [id number]) Rx #: [id number] Aimovig 70MG /ML auto-injectors Waiting for determination

## 2022-04-24 NOTE — Patient Instructions (Signed)
It was very nice to see you today!  We will give you an injection of Toradol today.  You can take an Imitrex with Benadryl when you get home.  We will start Aimovig.  This should help with your migraines.  Please send me a message in a few weeks to let me know how this is working.  Take care, Dr Jimmey Ralph  PLEASE NOTE:  If you had any lab tests please let us know if you have not heard back within a few days. You may see your results on mychart before we have a chance to review them but we will give you a call once they are reviewed by [place]. If we ordered any referrals today, please let us know if you have not heard from their office within the next week.   Please try these tips to maintain a healthy lifestyle:  Eat at least 3 REAL meals and 1-2 snacks per day.  Aim for no more than 5 hours between eating.  If you eat breakfast, please do so within one hour of getting up.   Each meal should contain half fruits/vegetables, one quarter protein, and one quarter carbs (no bigger than a computer mouse)  Cut down on sweet beverages. This includes juice, soda, and sweet tea.   Drink at least 1 glass of water with each meal and aim for at least 8 glasses per day  Exercise at least 150 minutes every week.

## 2022-04-27 ENCOUNTER — Other Ambulatory Visit: Payer: Self-pay | Admitting: *Deleted

## 2022-04-27 NOTE — Telephone Encounter (Signed)
Spoke with a Dealer for question for [place] Rx Aimovig  Waiting for determination

## 2022-04-30 NOTE — Telephone Encounter (Signed)
Patient aware Rx Aimovig denied by insurance  Advise to contact insurance for medication coverage

## 2022-04-30 NOTE — Telephone Encounter (Signed)
Received letter of denial  Left message to return call to our office at their convenience.

## 2022-05-01 ENCOUNTER — Encounter: Payer: Self-pay | Admitting: Family Medicine

## 2022-05-04 NOTE — Telephone Encounter (Signed)
Form and office note faxed to [facility]. Pending decision.

## 2022-05-25 NOTE — Telephone Encounter (Signed)
Spoke to [employer], they have sent over appeal form, will fax when completed.

## 2022-06-03 ENCOUNTER — Telehealth: Payer: BC Managed Care – PPO | Admitting: Physician Assistant

## 2022-06-03 DIAGNOSIS — M10071 Idiopathic gout, right ankle and foot: Secondary | ICD-10-CM | POA: Diagnosis not present

## 2022-06-03 MED ORDER — PREDNISONE 20 MG PO TABS: 40.0000 mg | ORAL_TABLET | Freq: Every day | ORAL | 0 refills | Status: DC

## 2022-06-03 NOTE — Progress Notes (Signed)
E-Visit for Gout Symptoms  We are sorry that you are not feeling well. We are here to help!  Based on what you shared with me it looks like you have a flare of your gout.  Gout is a form of arthritis. It can cause pain and swelling in the joints. At first, it tends to affect only 1 joint - most frequently the big toe. It happens in people who have too much uric acid in the blood. Uric acid is a chemical that is produced when the body breaks down certain foods. Uric acid can form sharp needle-like crystals that build up in the joints and cause pain. Uric acid crystals can also form inside the tubes that carry urine from the kidneys to the bladder. These crystals can turn into "kidney stones" that can cause pain and problems with the flow of urine. People with gout get sudden "flares" or attacks of severe pain, most often the big toe, ankle, or knee. Often the joint also turns red and swells. Usually, only 1 joint is affected, but some people have pain in more than 1 joint. Gout flares tend to happen more often during the night.  The pain from gout can be extreme. The pain and swelling are worst at the beginning of a gout flare. The symptoms then get better within a few days to weeks. It is not clear how the body "turns off" a gout flare.  Do not start any NEW preventative medicine until the gout has cleared completely. However, If you are already on Probenecid or Allopurinol for CHRONIC gout, you may continue taking this during an active flare up  I have prescribed Prednisone 40 mg daily for 7   HOME CARE Losing weight can help relieve gout. It's not clear that following a specific diet plan will help with gout symptoms but eating a balanced diet can help improve your overall health. It can also help you lose weight, if you are overweight. In general, a healthy diet includes plenty of fruits, vegetables, whole grains, and low-fat dairy products (labelled "low fat", skim, 2%). Avoid sugar sweetened  drinks (including sodas, tea, juice and juice blends, coffee drinks and sports drinks) Limit alcohol to 1-2 drinks of beer, spirits or wine daily these can make gout flares worse. Some people with gout also have other health problems, such as heart disease, high blood pressure, kidney disease, or obesity. If you have any of these issues, it's important to work with your doctor to manage them. This can help improve your overall health and might also help with your gout.  GET HELP RIGHT AWAY IF: Your symptoms persist after you have completed your treatment plan You develop severe diarrhea You develop abnormal sensations  You develop vomiting,   You develop weakness  You develop abdominal pain  FOLLOW UP WITH YOUR PRIMARY PROVIDER IF: If your symptoms do not improve within 10 days  MAKE SURE YOU  Understand these instructions. Will watch your condition. Will get help right away if you are not doing well or get worse.  Thank you for choosing an e-visit.  Your e-visit answers were reviewed by a board certified advanced clinical practitioner to complete your personal care plan. Depending upon the condition, your plan could have included both over the counter or prescription medications.  Please review your pharmacy choice. Make sure the pharmacy is open so you can pick up prescription now. If there is a problem, you may contact your provider through MyChart messaging and have   the prescription routed to another pharmacy.  Your safety is important to us. If you have drug allergies check your prescription carefully.   For the next 24 hours you can use MyChart to ask questions about today's visit, request a non-urgent call back, or ask for a work or school excuse. You will get an email in the next two days asking about your experience. I hope that your e-visit has been valuable and will speed your recovery.  I provided 5 minutes of non face-to-face time during this encounter for chart review and  documentation.   

## 2022-06-15 ENCOUNTER — Other Ambulatory Visit: Payer: Self-pay | Admitting: Family Medicine

## 2022-06-15 ENCOUNTER — Encounter: Payer: Self-pay | Admitting: Family Medicine

## 2022-06-15 DIAGNOSIS — M10071 Idiopathic gout, right ankle and foot: Secondary | ICD-10-CM

## 2022-07-13 ENCOUNTER — Other Ambulatory Visit: Payer: Self-pay | Admitting: *Deleted

## 2022-07-13 MED ORDER — SUMATRIPTAN SUCCINATE 50 MG PO TABS: 50.0000 mg | ORAL_TABLET | ORAL | 2 refills | Status: DC | PRN

## 2022-08-17 ENCOUNTER — Encounter: Payer: Self-pay | Admitting: *Deleted

## 2022-11-05 ENCOUNTER — Encounter: Payer: Self-pay | Admitting: *Deleted

## 2022-11-19 ENCOUNTER — Telehealth: Payer: BC Managed Care – PPO | Admitting: Physician Assistant

## 2022-11-19 DIAGNOSIS — M109 Gout, unspecified: Secondary | ICD-10-CM

## 2022-11-19 NOTE — Progress Notes (Signed)
Thank you for submitting your e-visit. We are sorry that you are not feeling well! On review of your submission, you have either listed that you are in a state outside our coverage area or have listed a pharmacy outside that area. Our providers are licensed in [place] and [place], and cannot treat or send a medication to a patient outside that coverage area due to telehealth laws.   In order to make sure you are taken care of today, there are a few options:  (1) Contact your PCP to see if they can see you via video and send the medication in for you (2) You can click HERE to be taken to our [facility] site to be paired with a provider licensed in your area. When signing up for a visit, you need to select the state you are currently in so you will be able to select the proper providers (3) Be seen locally at an urgent care facility.   Thank you again for choosing [facility] for your care needs. We hope you feel better soon!  

## 2022-11-29 ENCOUNTER — Telehealth: Payer: BC Managed Care – PPO | Admitting: Family Medicine

## 2022-11-29 DIAGNOSIS — M109 Gout, unspecified: Secondary | ICD-10-CM

## 2022-11-29 MED ORDER — PREDNISONE 20 MG PO TABS: 20.0000 mg | ORAL_TABLET | Freq: Two times a day (BID) | ORAL | 0 refills | Status: AC

## 2022-11-29 NOTE — Progress Notes (Signed)
E-Visit for Gout Symptoms  We are sorry that you are not feeling well. We are here to help!  Based on what you shared with me it looks like you have a flare of your gout.  Gout is a form of arthritis. It can cause pain and swelling in the joints. At first, it tends to affect only 1 joint - most frequently the big toe. It happens in people who have too much uric acid in the blood. Uric acid is a chemical that is produced when the body breaks down certain foods. Uric acid can form sharp needle-like crystals that build up in the joints and cause pain. Uric acid crystals can also form inside the tubes that carry urine from the kidneys to the bladder. These crystals can turn into "kidney stones" that can cause pain and problems with the flow of urine. People with gout get sudden "flares" or attacks of severe pain, most often the big toe, ankle, or knee. Often the joint also turns red and swells. Usually, only 1 joint is affected, but some people have pain in more than 1 joint. Gout flares tend to happen more often during the night.  The pain from gout can be extreme. The pain and swelling are worst at the beginning of a gout flare. The symptoms then get better within a few days to weeks. It is not clear how the body "turns off" a gout flare.  Do not start any NEW preventative medicine until the gout has cleared completely. However, If you are already on Probenecid or Allopurinol for CHRONIC gout, you may continue taking this during an active flare up  I have prescribed 20 mg twice a day for 5 days.    HOME CARE Losing weight can help relieve gout. It's not clear that following a specific diet plan will help with gout symptoms but eating a balanced diet can help improve your overall health. It can also help you lose weight, if you are overweight. In general, a healthy diet includes plenty of fruits, vegetables, whole grains, and low-fat dairy products (labelled "low fat", skim, 2%). Avoid sugar sweetened  drinks (including sodas, tea, juice and juice blends, coffee drinks and sports drinks) Limit alcohol to 1-2 drinks of beer, spirits or wine daily these can make gout flares worse. Some people with gout also have other health problems, such as heart disease, high blood pressure, kidney disease, or obesity. If you have any of these issues, it's important to work with your doctor to manage them. This can help improve your overall health and might also help with your gout.  GET HELP RIGHT AWAY IF: Your symptoms persist after you have completed your treatment plan You develop severe diarrhea You develop abnormal sensations  You develop vomiting,   You develop weakness  You develop abdominal pain  FOLLOW UP WITH YOUR PRIMARY PROVIDER IF: If your symptoms do not improve within 10 days  MAKE SURE YOU  Understand these instructions. Will watch your condition. Will get help right away if you are not doing well or get worse.  Thank you for choosing an e-visit.  Your e-visit answers were reviewed by a board certified advanced clinical practitioner to complete your personal care plan. Depending upon the condition, your plan could have included both over the counter or prescription medications.  Please review your pharmacy choice. Make sure the pharmacy is open so you can pick up prescription now. If there is a problem, you may contact your provider through [place]  messaging and have the prescription routed to another pharmacy.  Your safety is important to [place]. If you have drug allergies check your prescription carefully.   For the next 24 hours you can use MyChart to ask questions about today's visit, request a non-urgent call back, or ask for a work or school excuse. You will get an email in the next two days asking about your experience. I hope that your e-visit has been valuable and will speed your recovery.    have provided 5 minutes of non face to face time during this encounter for chart review  and documentation.

## 2022-12-21 ENCOUNTER — Other Ambulatory Visit: Payer: Self-pay | Admitting: Family Medicine

## 2023-02-21 ENCOUNTER — Other Ambulatory Visit: Payer: Self-pay | Admitting: Family Medicine

## 2023-04-22 ENCOUNTER — Other Ambulatory Visit: Payer: Self-pay | Admitting: Family Medicine

## 2023-05-02 ENCOUNTER — Telehealth: Payer: BC Managed Care – PPO | Admitting: Family

## 2023-05-02 DIAGNOSIS — M109 Gout, unspecified: Secondary | ICD-10-CM | POA: Diagnosis not present

## 2023-05-02 MED ORDER — PREDNISONE 20 MG PO TABS
40.0000 mg | ORAL_TABLET | Freq: Every day | ORAL | 0 refills | Status: AC
Start: 2023-05-02 — End: 2023-05-09

## 2023-05-02 MED ORDER — COLCHICINE 0.6 MG PO TABS
ORAL_TABLET | ORAL | 0 refills | Status: DC
Start: 2023-05-02 — End: 2024-08-11

## 2023-05-02 MED ORDER — NAPROXEN 500 MG PO TABS
500.0000 mg | ORAL_TABLET | Freq: Two times a day (BID) | ORAL | 0 refills | Status: DC
Start: 2023-05-02 — End: 2024-09-18

## 2023-05-02 NOTE — Progress Notes (Signed)
E-Visit for Gout Symptoms  We are sorry that you are not feeling well. We are here to help!  Based on what you shared with me it looks like you have a flare of your gout.  Gout is a form of arthritis. It can cause pain and swelling in the joints. At first, it tends to affect only 1 joint - most frequently the big toe. It happens in people who have too much uric acid in the blood. Uric acid is a chemical that is produced when the body breaks down certain foods. Uric acid can form sharp needle-like crystals that build up in the joints and cause pain. Uric acid crystals can also form inside the tubes that carry urine from the kidneys to the bladder. These crystals can turn into "kidney stones" that can cause pain and problems with the flow of urine. People with gout get sudden "flares" or attacks of severe pain, most often the big toe, ankle, or knee. Often the joint also turns red and swells. Usually, only 1 joint is affected, but some people have pain in more than 1 joint. Gout flares tend to happen more often during the night.  The pain from gout can be extreme. The pain and swelling are worst at the beginning of a gout flare. The symptoms then get better within a few days to weeks. It is not clear how the body "turns off" a gout flare.  Do not start any NEW preventative medicine until the gout has cleared completely. However, If you are already on Probenecid or Allopurinol for CHRONIC gout, you may continue taking this during an active flare up   I have prescribed Naprosyn 500 mg twice daily as needed for mild pain for 7 days, I have prescribed Colchicine 0.6 mg tabs - Take 2 tabs immediately, then 1 tab twice per day for the duration of the flare up to a max of 7 days (but discontinue for stomach pains or diarrhea) , and I have prescribed Prednisone 40 mg daily for 7   HOME CARE Losing weight can help relieve gout. It's not clear that following a specific diet plan will help with gout symptoms but  eating a balanced diet can help improve your overall health. It can also help you lose weight, if you are overweight. In general, a healthy diet includes plenty of fruits, vegetables, whole grains, and low-fat dairy products (labelled "low fat", skim, 2%). Avoid sugar sweetened drinks (including sodas, tea, juice and juice blends, coffee drinks and sports drinks) Limit alcohol to 1-2 drinks of beer, spirits or wine daily these can make gout flares worse. Some people with gout also have other health problems, such as heart disease, high blood pressure, kidney disease, or obesity. If you have any of these issues, it's important to work with your doctor to manage them. This can help improve your overall health and might also help with your gout.  GET HELP RIGHT AWAY IF: Your symptoms persist after you have completed your treatment plan You develop severe diarrhea You develop abnormal sensations  You develop vomiting,   You develop weakness  You develop abdominal pain  FOLLOW UP WITH YOUR PRIMARY PROVIDER IF: If your symptoms do not improve within 10 days  MAKE SURE YOU  Understand these instructions. Will watch your condition. Will get help right away if you are not doing well or get worse.  Thank you for choosing an e-visit.  Your e-visit answers were reviewed by a board certified advanced clinical  practitioner to complete your personal care plan. Depending upon the condition, your plan could have included both over the counter or prescription medications.  Please review your pharmacy choice. Make sure the pharmacy is open so you can pick up prescription now. If there is a problem, you may contact your provider through [employer] and have the prescription routed to another pharmacy.  Your safety is important to [place]. If you have drug allergies check your prescription carefully.   For the next 24 hours you can use MyChart to ask questions about today's visit, request a non-urgent call  back, or ask for a work or school excuse. You will get an email in the next two days asking about your experience. I hope that your e-visit has been valuable and will speed your recovery.  Approximately 5 minutes was spent documenting and reviewing patient's chart.

## 2023-05-13 ENCOUNTER — Other Ambulatory Visit: Payer: Self-pay | Admitting: Family

## 2023-05-13 DIAGNOSIS — M109 Gout, unspecified: Secondary | ICD-10-CM

## 2023-10-11 ENCOUNTER — Encounter: Payer: BC Managed Care – PPO | Admitting: Family Medicine

## 2023-10-14 ENCOUNTER — Ambulatory Visit: Payer: BC Managed Care – PPO | Admitting: Family Medicine

## 2023-10-15 ENCOUNTER — Encounter: Payer: BC Managed Care – PPO | Admitting: Family Medicine

## 2023-11-11 ENCOUNTER — Encounter: Payer: Self-pay | Admitting: Family Medicine

## 2023-11-11 ENCOUNTER — Ambulatory Visit: Payer: BC Managed Care – PPO | Admitting: Family Medicine

## 2023-11-11 VITALS — BP 133/79 | HR 60 | Temp 97.5°F | Ht 73.5 in | Wt 185.5 lb

## 2023-11-11 DIAGNOSIS — G8929 Other chronic pain: Secondary | ICD-10-CM | POA: Diagnosis not present

## 2023-11-11 DIAGNOSIS — R519 Headache, unspecified: Secondary | ICD-10-CM | POA: Diagnosis not present

## 2023-11-11 DIAGNOSIS — M109 Gout, unspecified: Secondary | ICD-10-CM

## 2023-11-11 DIAGNOSIS — Z1211 Encounter for screening for malignant neoplasm of colon: Secondary | ICD-10-CM

## 2023-11-11 MED ORDER — PREDNISONE 50 MG PO TABS: ORAL_TABLET | ORAL | 0 refills | Status: DC

## 2023-11-11 MED ORDER — SUMATRIPTAN SUCCINATE 50 MG PO TABS: 50.0000 mg | ORAL_TABLET | ORAL | 4 refills | Status: DC | PRN

## 2023-11-11 NOTE — Assessment & Plan Note (Signed)
Feels well today without any acute headache.  No red flag signs or symptoms.  He is still having headaches about once weekly or so.  Based on history he is likely having his typical migraines as well as cluster type headaches as well.  Likely having some tension type headaches as well.  He is currently on Imitrex and naproxen as needed which works well.  Does this about once per week.  Insurance would not approve his Aimovig and he has not been on any preventative therapy.  He is interested in starting high flow oxygen as needed for his cluster headaches.  Discussed with patient that insurance may not approve this however he will check with them to see if they cover.  Given overall low risk would be reasonable for [place] to try this.  Will give order for portable oxygen tank to use as needed for cluster headaches.   He will follow-up with [place] in a few weeks to let us know how he is doing.  We did discuss referral to neurology however deferred for today.  He will let us know if symptoms progress or worsen. Will refer to neurology at that time vs starting prophylactic medication such as Aimovig/Emgality or oral medication.

## 2023-11-11 NOTE — Patient Instructions (Addendum)
It was very nice to see you today!  I will refill your medications.  I will send prescription in for prednisone in case you have a gout flare while in [place].  We will send a prescription in for oxygen tank.  Please contact your insurance about coverage for this.  I will order a Cologuard for you today.  Please come back soon for your annual physical.  Return for Annual Physical.   Take care, Dr Jimmey Ralph  PLEASE NOTE:  If you had any lab tests, please let us know if you have not heard back within a few days. You may see your results on mychart before we have a chance to review them but we will give you a call once they are reviewed by [place].   If we ordered any referrals today, please let us know if you have not heard from their office within the next week.   If you had any urgent prescriptions sent in today, please check with the pharmacy within an hour of our visit to make sure the prescription was transmitted appropriately.   Please try these tips to maintain a healthy lifestyle:  Eat at least 3 REAL meals and 1-2 snacks per day.  Aim for no more than 5 hours between eating.  If you eat breakfast, please do so within one hour of getting up.   Each meal should contain half fruits/vegetables, one quarter protein, and one quarter carbs (no bigger than a computer mouse)  Cut down on sweet beverages. This includes juice, soda, and sweet tea.   Drink at least 1 glass of water with each meal and aim for at least 8 glasses per day  Exercise at least 150 minutes every week.

## 2023-11-11 NOTE — Assessment & Plan Note (Signed)
No acute flare currently however he will be going [place] and wishes to have a pocket prescription for prednisone in case he has a gout flare.  He is previously done well with prednisone.  Will give prescription for this today with instruction to not start unless he develops signs of a gout flare.  He will come back soon for CPE and we can check uric acid level at that point.

## 2023-11-11 NOTE — Progress Notes (Signed)
Nicholas Harmon is a 47 y.o. male who presents today for an office visit.  Assessment/Plan:  Chronic Problems Addressed Today: Chronic nonintractable headache Feels well today without any acute headache.  No red flag signs or symptoms.  He is still having headaches about once weekly or so.  Based on history he is likely having his typical migraines as well as cluster type headaches as well.  Likely having some tension type headaches as well.  He is currently on Imitrex and naproxen as needed which works well.  Does this about once per week.  Insurance would not approve his Aimovig and he has not been on any preventative therapy.  He is interested in starting high flow oxygen as needed for his cluster headaches.  Discussed with patient that insurance may not approve this however he will check with them to see if they cover.  Given overall low risk would be reasonable for [place] to try this.  Will give order for portable oxygen tank to use as needed for cluster headaches.   He will follow-up with [place] in a few weeks to let us know how he is doing.  We did discuss referral to neurology however deferred for today.  He will let us know if symptoms progress or worsen. Will refer to neurology at that time vs starting prophylactic medication such as Aimovig/Emgality or oral medication.  Gout No acute flare currently however he will be going abroad soon and wishes to have a pocket prescription for prednisone in case he has a gout flare.  He is previously done well with prednisone.  Will give prescription for this today with instruction to not start unless he develops signs of a gout flare.  He will come back soon for CPE and we can check uric acid level at that point.  Preventative Healthcare He will come back soon for Comprehensive Physical Exam (CPE) preventive care annual visit and we can check labs at that time.. Due for colon cancer screening - will order cologuard    Subjective:  HPI:  See A/P for  status of chronic conditions.  Patient is here today for follow-up.  I last saw him about 18 months ago.  At that time he was having an acute flare of his chronic headaches.  We refilled his Imitrex and gave him a dose of Toradol.  We also started Aimovig 70 mg once monthly.  He was not able to start this due to insurance. He is still getting headaches weekly. Consistent with prior migraines and he also thinks that he is having cluster headaches. No photophobia. He does sometimes get a watery eye and rhinorrhea with his headaches. Very rarely get nausea with his migraines.  He is currently getting a headache about once per week.  Usually takes Imitrex and naproxen with good improvement in symptoms.  No weakness or numbness.  Feels well today.  He will be going to [place] for Christmas next week. He had a flare of gout while in [place] last year and had to get a prescription for prednisone. HE would like to have a "pocket prescription" for this for his upcoming travel.  He does occasionally get flares for this.  Last flare was several months ago.       Objective:  Physical Exam: BP 133/79   Pulse 60   Temp (!) 97.5 F (36.4 C) (Temporal)   Ht 6' 1.5" (1.867 m)   Wt 185 lb 8 oz (84.1 kg)   SpO2 97%  BMI 24.14 kg/m   Gen: No acute distress, resting comfortably CV: Regular rate and rhythm with no murmurs appreciated Pulm: Normal work of breathing, clear to auscultation bilaterally with no crackles, wheezes, or rhonchi Neuro: Grossly normal, moves all extremities Psych: Normal affect and thought content      Miguel Medal M. Jimmey Ralph, MD 11/11/2023 9:22 AM

## 2024-03-13 ENCOUNTER — Ambulatory Visit: Payer: Self-pay

## 2024-03-13 ENCOUNTER — Emergency Department (HOSPITAL_COMMUNITY)
Admission: EM | Admit: 2024-03-13 | Discharge: 2024-03-13 | Disposition: A | Discharge status: Home / Self Care | Payer: Self-pay | Attending: Emergency Medicine | Admitting: Emergency Medicine

## 2024-03-13 ENCOUNTER — Other Ambulatory Visit: Payer: Self-pay

## 2024-03-13 DIAGNOSIS — Z7982 Long term (current) use of aspirin: Secondary | ICD-10-CM | POA: Insufficient documentation

## 2024-03-13 DIAGNOSIS — R040 Epistaxis: Secondary | ICD-10-CM | POA: Diagnosis not present

## 2024-03-13 MED ORDER — OXYMETAZOLINE HCL 0.05 % NA SOLN
2.0000 | Freq: Two times a day (BID) | NASAL | Status: DC | PRN
  Administered 2024-03-13: 2 via NASAL
  Filled 2024-03-13: qty 30

## 2024-03-13 NOTE — Discharge Instructions (Signed)
 Nosebleeds commonly recur.   Apply direct, uninterrupted pressure over the soft part of your nose for 20 minutes. If your bleeding has not stopped, you may use 1 puff of Afrin spray in the affected nostril. If your bleeding continues, please go to the ER and keep applying pressure to your nose. Never use afrin (oxymetazoline ) for more than 3 days.   You may also use a humidifier at home to prevent your nose from drying.   Do not blow out of your nose for the next couple of days.  Avoid straining when using the restroom or vigorous physical activity.  Return also immediately to the nearest ED if you develop chest pain, shortness of breath, dizziness or fainting, severe bleeding, or other concerns.

## 2024-03-13 NOTE — ED Triage Notes (Signed)
 Pt complains of nose bleed and blood coming out of left eye started 4 hours ago. Bleeding started spontaneously. No blurred vision. No bleeding from eye during triage. Pt denies pain. States he was seated at desk working when bleeding started. No injury

## 2024-03-13 NOTE — Telephone Encounter (Signed)
  Chief Complaint: Nosebleed Symptoms: continuous nosebleed, Frequency: started today Pertinent Negatives: Patient denies headache, high blood pressure Disposition: [x] ED /[] Urgent Care (no appt availability in office) / [] Appointment(In office/virtual)/ []  [place] [facility]/ [] Home Care/ [] Refused Recommended Disposition /[] [place] [facility]/ []  Follow-up with PCP Additional Notes: patient's wife called from ED-patient and wife arrived around 1500 to ED with patient having a severe nosebleed. Patient states nosebleed began around 11:00 AM today and hasn't stopped. Also with nosebleed patient reports blood coming out of his eye. No known history of high blood pressure. Patient denies cold symptoms or taking blood thinners. This RN stated that patient was in the best place to get care. Explained process for patient to be seen and that if they were to leave to go to UC, there would be a very strong likelihood of being sent back to ED. Both patient and wife verbalized understanding of plan and all questions answered.    Copied from CRM [id number]. Topic: Clinical - Medical Advice >> Mar 13, 2024  3:19 PM [place] E wrote: Reason for CRM: Patient's wife called in stating that her husband had an uncontrollable nosebleed this morning, which turned into blood coming out of his eye. Patient is at ER right now, but has not been seen yet, wife wanting to know if they should stay or go to urgent care. Reason for Disposition  [1] Bleeding present > 30 minutes AND [2] using correct method of direct pressure  Answer Assessment - Initial Assessment Questions 1. AMOUNT OF BLEEDING: "How bad is the bleeding?" "How much blood was lost?" "Has the bleeding stopped?"   - MILD: needed a couple tissues   - MODERATE: needed many tissues   - SEVERE: large blood clots, soaked many tissues, lasted more than 30 minutes      Severe 2. ONSET: "When did the nosebleed start?"      Started around 11:00 AM 3.  FREQUENCY: "How many nosebleeds have you had in the last 24 hours?"      One had 1 4. RECURRENT SYMPTOMS: "Have there been other recent nosebleeds?" If Yes, ask: "How long did it take you to stop the bleeding?" "What worked best?"      no 5. CAUSE: "What do you think caused this nosebleed?"     unsure 6. LOCAL FACTORS: "Do you have any cold symptoms?", "Have you been rubbing or picking at your nose?"     no 7. SYSTEMIC FACTORS: "Do you have high blood pressure or any bleeding problems?"     High blood pressure at one provider visit.  8. BLOOD THINNERS: "Do you take any blood thinners?" (e.g., aspirin, clopidogrel / Plavix, coumadin, heparin). Notes: Other strong blood thinners include: Arixtra (fondaparinux), Eliquis (apixaban), Pradaxa (dabigatran), and Xarelto (rivaroxaban).     no 9. OTHER SYMPTOMS: "Do you have any other symptoms?" (e.g., lightheadedness)     no  Protocols used: Nosebleed-A-AH

## 2024-03-13 NOTE — ED Provider Triage Note (Signed)
 Emergency Medicine Provider Triage Evaluation Note  Nicholas Harmon , a 48 y.o. male  was evaluated in triage.  Pt complains of nosebleed that started approximately 4 hours ago.  Denies any injury to the nose, bleeding started spontaneously.  Denies any dizziness.  Denies any blood thinner use.  Review of Systems  Positive: As above Negative: As above  Physical Exam  BP (!) 143/94 (BP Location: Right Arm)   Pulse 63   Temp 97.6 F (36.4 C)   Resp 16   Wt 84 kg   SpO2 100%   BMI 24.10 kg/m  Gen:   Awake, no distress   Resp:  Normal effort  MSK:   Moves extremities without difficulty    Medical Decision Making  Medically screening exam initiated at 4:38 PM.  Appropriate orders placed.  Era Hasty was informed that the remainder of the evaluation will be completed by another provider, this initial triage assessment does not replace that evaluation, and the importance of remaining in the ED until their evaluation is complete.     Rexie Catena, PA-C 03/13/24 1639

## 2024-03-13 NOTE — ED Provider Notes (Signed)
  EMERGENCY DEPARTMENT AT [facility] Provider Note   CSN: [id number] Arrival date & time: 03/13/24  1502     History  Chief Complaint  Patient presents with   Epistaxis    Nicholas Harmon is a 48 y.o. male with no significant past medical history presents with concern for a nosebleed out of his right nostril that started proximately 4 hours prior to arrival.  States this happened when sitting at his desk.  Denies any trauma to the nose.  Denies any blood thinner use   Epistaxis      Home Medications Prior to Admission medications   Medication Sig Start Date End Date Taking? Authorizing Provider  aspirin-acetaminophen-caffeine (EXCEDRIN MIGRAINE) 250-250-65 MG tablet Take by mouth every 6 (six) hours as needed for headache.    [provider]  colchicine  0.6 MG tablet Take 2 tabs immediately, then 1 tab twice per day for the duration of the flare up to a max of 7 days 05/02/23   Tommas Fragmin A, FNP  Multiple Vitamin (MULTIVITAMIN) tablet Take 1 tablet by mouth daily.    [provider]  naproxen  (NAPROSYN ) 500 MG tablet Take 1 tablet (500 mg total) by mouth 2 (two) times daily with a meal. 05/02/23   Hawks, Christy A, FNP  predniSONE  (DELTASONE ) 50 MG tablet Take 1 tablet daily for 5 days. 11/11/23   Rodney Clamp, MD  SUMAtriptan  (IMITREX ) 50 MG tablet Take 1 tablet (50 mg total) by mouth every 2 (two) hours as needed for migraine. May repeat in 2 hours if headache persists or recurs. 11/11/23   Rodney Clamp, MD      Allergies    Patient has no known allergies.    Review of Systems   Review of Systems  HENT:  Positive for nosebleeds.     Physical Exam Updated Vital Signs BP (!) 143/94 (BP Location: Right Arm)   Pulse 63   Temp 97.6 F (36.4 C)   Resp 16   Wt 84 kg   SpO2 100%   BMI 24.10 kg/m  Physical Exam Vitals and nursing note reviewed.  Constitutional:      Appearance: Normal appearance.  HENT:     Head: Atraumatic.      Nose:     Comments: Right nose with bloody discharge upon initial evaluation.  After administration of Afrin, no active bleeding.  No obvious nasal trauma or source of bleeding. Eyes:     Extraocular Movements: Extraocular movements intact.     Conjunctiva/sclera: Conjunctivae normal.     Pupils: Pupils are equal, round, and reactive to light.  Cardiovascular:     Rate and Rhythm: Normal rate and regular rhythm.  Pulmonary:     Effort: Pulmonary effort is normal.  Neurological:     General: No focal deficit present.     Mental Status: He is alert.  Psychiatric:        Mood and Affect: Mood normal.        Behavior: Behavior normal.     ED Results / Procedures / Treatments   Labs (all labs ordered are listed, but only abnormal results are displayed) Labs Reviewed - No data to display  EKG None  Radiology No results found.  Procedures Procedures    Medications Ordered in ED Medications  oxymetazoline  (AFRIN) 0.05 % nasal spray 2 spray (2 sprays Each Nare Given 03/13/24 1644)    ED Course/ Medical Decision Making/ A&P  Medical Decision Making Risk OTC drugs.     Differential diagnosis includes but is not limited to anterior epistaxis, posterior epistaxis, nasal trauma  ED Course:  Upon initial evaluation, patient is well-appearing, stable vital signs aside from mildly low blood pressure 143/94.  He did have active bleeding from the right nare upon initial evaluation.  Difficult to identify source of bleeding upon initial evaluation.  Afrin nasal spray administered and patient was told to limit direct pressure on the nose.  Medications Given: Afrin   Upon re-evaluation, patient without any active bleeding from the nasal cavities 45 minutes after Afrin administration.  I do not see any obvious source of bleeding.  This may have been a more posterior bleed.  He denies any dizziness, weakness, low concern for acute blood loss  anemia.  No indication for labs at this time.  Given no recurrence of bleeding, feel he would be appropriate for discharge home.    Impression: Right epistaxis  Disposition:  The patient was discharged home with instructions to apply direct pressure to the nare for 20 minutes if bleeding recurs.  You may then try to use Afrin if needed.  If bleeding not controlled with direct pressure and Afrin, he was instructed to come back to the ER.  Instructed to refrain from blowing out his nose.  Use humidifier at home to prevent recurrence. Return precautions given.    This chart was dictated using voice recognition software, Dragon. Despite the best efforts of this provider to proofread and correct errors, errors may still occur which can change documentation meaning.          Final Clinical Impression(s) / ED Diagnoses Final diagnoses:  Right-sided epistaxis    Rx / DC Orders ED Discharge Orders     None         Rexie Catena, PA-C 03/13/24 1726    Arvilla Birmingham, MD 03/13/24 2026

## 2024-03-14 NOTE — Telephone Encounter (Signed)
 See note

## 2024-03-16 ENCOUNTER — Ambulatory Visit: Payer: Self-pay | Admitting: *Deleted

## 2024-03-16 NOTE — Telephone Encounter (Signed)
 Copied from CRM [id number]. Topic: Clinical - Red Word Triage >> Mar 16, 2024  1:21 PM Abigail D wrote: Red Word that prompted transfer to Nurse Triage: Sudden nose bleed, triaged 4/21 and went to ER. today it started up again Reason for Disposition  [1] Nosebleed AND [2] bleeding present < 30 minutes AND [3] using correct technique per guideline  Answer Assessment - Initial Assessment Questions 1. AMOUNT OF BLEEDING: "How bad is the bleeding?" "How much blood was lost?" "Has the bleeding stopped?"   - MILD: needed a couple tissues   - MODERATE: needed many tissues   - SEVERE: large blood clots, soaked many tissues, lasted more than 30 minutes      My nose is bleeding.   I went to [facility] ED a couple of days ago for a nose bleed that was bad.   They got it stopped bleeding.    2. ONSET: "When did the nosebleed start?"      An hour ago it started again bleeding not as bad as before.   I take Excedrin for headaches is only blood thinner I'm on.   Maybe once a day.    3. FREQUENCY: "How many nosebleeds have you had in the last 24 hours?"      Day before yesterday first one.  Went to ED.   Posterior nosebleed high up.  Known cause is unknown.    4. RECURRENT SYMPTOMS: "Have there been other recent nosebleeds?" If Yes, ask: "How long did it take you to stop the bleeding?" "What worked best?"      Yes Day before last.    They sent me home with Afrin.   I did use some just now.   I'm still bleeding a little.   I didn't know what to do.   I sprayed the Afrin in my nose a few min. Ago about 30 min ago.     The left side is bleeding.       5. CAUSE: "What do you think caused this nosebleed?"     I don't know 6. LOCAL FACTORS: "Do you have any cold symptoms?", "Have you been rubbing or picking at your nose?"     No colds or sinus issues.   I don't use any nasal sprays.    7. SYSTEMIC FACTORS: "Do you have high blood pressure or any bleeding problems?"     I don't have high BP.    Not medications 8. BLOOD  THINNERS: "Do you take any blood thinners?" (e.g., aspirin, clopidogrel / Plavix, coumadin, heparin). Notes: Other strong blood thinners include: Arixtra (fondaparinux), Eliquis (apixaban), Pradaxa (dabigatran), and Xarelto (rivaroxaban).     I take an Excedrin almost every day.     9. OTHER SYMPTOMS: "Do you have any other symptoms?" (e.g., lightheadedness)     No lightheadedness or dizziness. 10. PREGNANCY: "Is there any chance you are pregnant?" "When was your last menstrual period?"       N/A  Protocols used: Nosebleed-A-AH  Chief Complaint: Nose bleeding from left nostril.   Went to ED 4/22 because of a nosebleed he could not get stopped.   They got it stopped using Afrin in ED. Symptoms: Nose bleed again today 4/24.   Pinching nostrils and has already sprayed Afrin in his nose.   "It's not bleeding as bad as it did when I went to the ED".   He takes Excedrin almost daily for headaches, only blood thinner he is on.  Does not have hypertension  or being treated for it.    Frequency: 4/22 and again 4/24 Pertinent Negatives: Patient denies it bleeding as heavy as on 4/22.   It's almost stopped with pinching his nostrils and using the Afrin nasal spray they sent home with him from the ED.    Denies being dizzy or lightheaded today. Disposition: [] ED /[] Urgent Care (no appt availability in office) / [x] Appointment(In office/virtual)/ []  [place] [facility]/ [] Home Care/ [] Refused Recommended Disposition /[] [place] [facility]/ []  Follow-up with PCP Additional Notes: I remained on the phone with him a few min. Until he had the bleeding virtually stopped.   "It's just barely bleeding now".   I instructed him to return to the ED if he started bleeding again and could not get it stopped with pinching his nostrils for 30 min and using the Afrin spray.   He was agreeable to this plan.   Appt made with Versa Gore NP for 03/17/2024.

## 2024-03-16 NOTE — Telephone Encounter (Signed)
 noted

## 2024-03-17 ENCOUNTER — Ambulatory Visit (INDEPENDENT_AMBULATORY_CARE_PROVIDER_SITE_OTHER): Admitting: Family

## 2024-03-17 ENCOUNTER — Encounter: Payer: Self-pay | Admitting: Family

## 2024-03-17 VITALS — BP 115/71 | HR 59 | Temp 98.0°F | Ht 73.0 in | Wt 178.2 lb

## 2024-03-17 DIAGNOSIS — R040 Epistaxis: Secondary | ICD-10-CM | POA: Diagnosis not present

## 2024-03-17 NOTE — Progress Notes (Signed)
 Patient ID: Nicholas Harmon, male    DOB: 05/23/1976, 48 y.o.   MRN: [id number]  Chief Complaint  Patient presents with   Epistaxis    Pt c/o nose bleeds, present last on Wednesday and started again yesterday. Seen in ED on 4/23, Has tried afrin which did help sx.   Discussed the use of AI scribe software for clinical note transcription with the patient, who gave verbal consent to proceed.  History of Present Illness The patient presents with recurrent nosebleeds, initially presenting as a severe episode that required an emergency room visit. The initial episode was characterized by intense bilateral nasal bleeding that lasted for approximately three hours, with blood even appearing in the patient's eyes. The patient reports that the bleeding was so severe that it soaked a towel and caused temporary vision loss due to the blood in his eyes. The patient was in [place] prior to the onset of symptoms, a location with a drier climate than his usual residence.  Following the initial episode, the patient experienced another nosebleed the next day, this time only on the left side. This episode was less severe, with the bleeding stopping after approximately half an hour. The patient has been using Afrin as instructed by the emergency room staff to manage the nosebleeds.  The patient has a history of headaches, for which he regularly takes Excedrin and sumatriptan . He denies any recent increase in the frequency or severity of headaches. The patient does not have any known allergies and does not take any allergy medications.  Assessment & Plan Epistaxis - Recurrent epistaxis likely due to posterior nasal bleeding, exacerbated by dry nasal passages, recent travel, and Excedrin use. No open sores visible in nasal passages on exam. - Use OTC generic saline nasal spray TID for moisturizing. - Use Afrin PRN for acute bleeding as directed. - Apply pressure at nasal bridge during bleeding for at least 20-86minutes  with head bent forward. - Avoid nasal insertion of objects.     Subjective:    Outpatient Medications Prior to Visit  Medication Sig Dispense Refill   aspirin-acetaminophen-caffeine (EXCEDRIN MIGRAINE) 250-250-65 MG tablet Take by mouth every 6 (six) hours as needed for headache.     colchicine  0.6 MG tablet Take 2 tabs immediately, then 1 tab twice per day for the duration of the flare up to a max of 7 days 21 tablet 0   Multiple Vitamin (MULTIVITAMIN) tablet Take 1 tablet by mouth daily.     naproxen  (NAPROSYN ) 500 MG tablet Take 1 tablet (500 mg total) by mouth 2 (two) times daily with a meal. 30 tablet 0   SUMAtriptan  (IMITREX ) 50 MG tablet Take 1 tablet (50 mg total) by mouth every 2 (two) hours as needed for migraine. May repeat in 2 hours if headache persists or recurs. 18 tablet 4   predniSONE  (DELTASONE ) 50 MG tablet Take 1 tablet daily for 5 days. (Patient not taking: Reported on 03/17/2024) 5 tablet 0   No facility-administered medications prior to visit.   Past Medical History:  Diagnosis Date   Chronic headaches    No past surgical history on file. No Known Allergies    Objective:    Physical Exam Vitals and nursing note reviewed.  Constitutional:      General: He is not in acute distress.    Appearance: Normal appearance.  HENT:     Head: Normocephalic.     Nose: No nasal tenderness, mucosal edema, congestion or rhinorrhea.  Right Nostril: No foreign body or epistaxis.     Left Nostril: No foreign body or epistaxis.  Cardiovascular:     Rate and Rhythm: Normal rate and regular rhythm.  Pulmonary:     Effort: Pulmonary effort is normal.     Breath sounds: Normal breath sounds.  Musculoskeletal:        General: Normal range of motion.     Cervical back: Normal range of motion.  Skin:    General: Skin is warm and dry.  Neurological:     Mental Status: He is alert and oriented to person, place, and time.  Psychiatric:        Mood and Affect: Mood normal.     BP 115/71 (BP Location: Left Arm, Patient Position: Sitting, Cuff Size: Large)   Pulse (!) 59   Temp 98 F (36.7 C) (Temporal)   Ht 6\' 1"  (1.854 m)   Wt 178 lb 4 oz (80.9 kg)   SpO2 99%   BMI 23.52 kg/m  Wt Readings from Last 3 Encounters:  03/17/24 178 lb 4 oz (80.9 kg)  03/13/24 185 lb 3 oz (84 kg)  11/11/23 185 lb 8 oz (84.1 kg)      Versa Gore, NP

## 2024-03-18 ENCOUNTER — Encounter: Payer: Self-pay | Admitting: Family Medicine

## 2024-03-20 ENCOUNTER — Other Ambulatory Visit: Payer: Self-pay | Admitting: *Deleted

## 2024-03-20 DIAGNOSIS — R040 Epistaxis: Secondary | ICD-10-CM

## 2024-03-20 NOTE — Telephone Encounter (Signed)
**Note De-identified  [place] Obfuscation** Please advise 

## 2024-03-20 NOTE — Telephone Encounter (Signed)
 Referral placed.

## 2024-03-20 NOTE — Telephone Encounter (Signed)
 Ok to place referral.  Jinny Mounts. Daneil Dunker, MD 03/20/2024 10:13 AM

## 2024-04-22 ENCOUNTER — Other Ambulatory Visit: Payer: Self-pay | Admitting: Family Medicine

## 2024-05-12 ENCOUNTER — Telehealth: Admitting: Physician Assistant

## 2024-05-12 DIAGNOSIS — M109 Gout, unspecified: Secondary | ICD-10-CM | POA: Diagnosis not present

## 2024-05-13 MED ORDER — PREDNISONE 20 MG PO TABS
40.0000 mg | ORAL_TABLET | Freq: Every day | ORAL | 0 refills | Status: AC
Start: 2024-05-13 — End: 2024-05-20

## 2024-05-13 NOTE — Progress Notes (Signed)
 E-Visit for Gout Symptoms  We are sorry that you are not feeling well. We are here to help!  Based on what you shared with me it looks like you have a flare of your gout.  Gout is a form of arthritis. It can cause pain and swelling in the joints. At first, it tends to affect only 1 joint - most frequently the big toe. It happens in people who have too much uric acid in the blood. Uric acid is a chemical that is produced when the body breaks down certain foods. Uric acid can form sharp needle-like crystals that build up in the joints and cause pain. Uric acid crystals can also form inside the tubes that carry urine from the kidneys to the bladder. These crystals can turn into "kidney stones" that can cause pain and problems with the flow of urine. People with gout get sudden "flares" or attacks of severe pain, most often the big toe, ankle, or knee. Often the joint also turns red and swells. Usually, only 1 joint is affected, but some people have pain in more than 1 joint. Gout flares tend to happen more often during the night.  The pain from gout can be extreme. The pain and swelling are worst at the beginning of a gout flare. The symptoms then get better within a few days to weeks. It is not clear how the body "turns off" a gout flare.  Do not start any NEW preventative medicine until the gout has cleared completely. However, If you are already on Probenecid or Allopurinol for CHRONIC gout, you may continue taking this during an active flare up  I have prescribed Prednisone 40 mg daily for 7   HOME CARE Losing weight can help relieve gout. It's not clear that following a specific diet plan will help with gout symptoms but eating a balanced diet can help improve your overall health. It can also help you lose weight, if you are overweight. In general, a healthy diet includes plenty of fruits, vegetables, whole grains, and low-fat dairy products (labelled low fat, skim, 2%). Avoid sugar sweetened  drinks (including sodas, tea, juice and juice blends, coffee drinks and sports drinks) Limit alcohol to 1-2 drinks of beer, spirits or wine daily these can make gout flares worse. Some people with gout also have other health problems, such as heart disease, high blood pressure, kidney disease, or obesity. If you have any of these issues, it's important to work with your doctor to manage them. This can help improve your overall health and might also help with your gout.  GET HELP RIGHT AWAY IF: Your symptoms persist after you have completed your treatment plan You develop severe diarrhea You develop abnormal sensations  You develop vomiting,   You develop weakness  You develop abdominal pain  FOLLOW UP WITH YOUR PRIMARY PROVIDER IF: If your symptoms do not improve within 10 days  MAKE SURE YOU  Understand these instructions. Will watch your condition. Will get help right away if you are not doing well or get worse.  Thank you for choosing an e-visit.  Your e-visit answers were reviewed by a board certified advanced clinical practitioner to complete your personal care plan. Depending upon the condition, your plan could have included both over the counter or prescription medications.  Please review your pharmacy choice. Make sure the pharmacy is open so you can pick up prescription now. If there is a problem, you may contact your provider through [employer] and have  the prescription routed to another pharmacy.  Your safety is important to [place]. If you have drug allergies check your prescription carefully.   For the next 24 hours you can use MyChart to ask questions about today's visit, request a non-urgent call back, or ask for a work or school excuse. You will get an email in the next two days asking about your experience. I hope that your e-visit has been valuable and will speed your recovery.

## 2024-05-13 NOTE — Progress Notes (Signed)
 I have spent 5 minutes in review of e-visit questionnaire, review and updating patient chart, medical decision making and response to patient.   Laure Kidney, PA-C

## 2024-05-29 ENCOUNTER — Ambulatory Visit (INDEPENDENT_AMBULATORY_CARE_PROVIDER_SITE_OTHER): Admitting: Otolaryngology

## 2024-05-29 ENCOUNTER — Encounter (INDEPENDENT_AMBULATORY_CARE_PROVIDER_SITE_OTHER): Payer: Self-pay | Admitting: Otolaryngology

## 2024-05-29 VITALS — BP 138/79 | HR 77 | Wt 170.0 lb

## 2024-05-29 DIAGNOSIS — H6123 Impacted cerumen, bilateral: Secondary | ICD-10-CM

## 2024-05-29 DIAGNOSIS — R040 Epistaxis: Secondary | ICD-10-CM

## 2024-05-29 MED ORDER — MUPIROCIN 2 % EX OINT: 1.0000 | TOPICAL_OINTMENT | Freq: Two times a day (BID) | CUTANEOUS | 0 refills | Status: AC

## 2024-05-29 NOTE — Progress Notes (Signed)
 Dear Dr. Kennyth, Here is my assessment for our mutual patient, Nicholas Harmon. Thank you for allowing me the opportunity to care for your patient. Please do not hesitate to contact me should you have any other questions. Sincerely, Dr. Eldora Blanch  Otolaryngology Clinic Note  HISTORY: Nicholas Harmon is a 48 y.o. male referred by Dr. Kennyth for evaluation of epistaxis and cerumen impaction  Initial visit (05/2024): Epistaxis history: randomly started in April, unclear antecedent event, mostly from left side. Went to ED, stopped with afrin, and he used that. Stopped, but then would recur 1-2x/week. Bled once or twice last 2-3 weeks. If he rubs his nose on the outside or sneeze, he feels like it will recur. HTN: no CKD/Liver dysfunction: no Anticoagulation/AP: no, but on Excedrin for migraines (~3x/week) Trauma: no History of Sinusitis: no Nasal obstruction: no Nasal procedures: no Current nasal medication use: tried saline spray, did not help much. He is currently not using nasal medications.   Also notes cerumen impaction with intermittent fullness. Has had the issue since he was a kid with multiple prior disimpactions. Ears do feel intermittently full. Does not use anything for this.  Patient denies: ear pain, vertigo, drainage, tinnitus Patient additionally denies: deep pain in ear canal, eustachian tube symptoms such as popping/crackling, sensitive to pressure changes Patient also denies barotrauma, vestibular suppressant use, ototoxic medication use Prior ear surgery: no  GLP-1: no Tobacco: no  PMHx: Gout, Migraines  RADIOGRAPHIC EVALUATION AND INDEPENDENT REVIEW OF OTHER RECORDS:: ED Notes 03/13/2024 Dr. Cottie (03/13/2024): right sided epistaxis, no trauma to nose; resolved with afrin; Dx: Epistaxis; Rx: observation [facility] Comment (03/17/2024): noted recurrent nose bleeds, prior drier climate due to recent travel to [facility]; another bleed next day on left; Dx: Epistaxis; Rx: afrin PRN,  saline spray Labs CMP 10/02/2021 and CBC 09/15/2021: WBC 5.3, Hgb 13.7; BUN/Cr wnl; LFTs wnl Past Medical History:  Diagnosis Date   Chronic headaches    History reviewed. No pertinent surgical history. Family History  Problem Relation Age of Onset   Hypertension Mother    Ovarian cancer Mother    Breast cancer Maternal Grandmother    Cancer Maternal Grandmother    Prostate cancer Father    Heart disease Maternal Grandfather    COPD Paternal Grandmother    COPD Paternal Grandfather    Colon cancer Neg Hx    Social History   Tobacco Use   Smoking status: Never   Smokeless tobacco: Never  Substance Use Topics   Alcohol use: Yes   No Known Allergies Current Outpatient Medications  Medication Sig Dispense Refill   aspirin-acetaminophen-caffeine (EXCEDRIN MIGRAINE) 250-250-65 MG tablet Take by mouth every 6 (six) hours as needed for headache.     colchicine  0.6 MG tablet Take 2 tabs immediately, then 1 tab twice per day for the duration of the flare up to a max of 7 days 21 tablet 0   Multiple Vitamin (MULTIVITAMIN) tablet Take 1 tablet by mouth daily.     mupirocin  ointment (BACTROBAN ) 2 % Apply 1 Application topically 2 (two) times daily. Apply to left nostril twice daily for 7 days. Then switch to ayr gel 22 g 0   naproxen  (NAPROSYN ) 500 MG tablet Take 1 tablet (500 mg total) by mouth 2 (two) times daily with a meal. 30 tablet 0   SUMAtriptan  (IMITREX ) 50 MG tablet TAKE 1 TABLET (50 MG TOTAL) BY MOUTH EVERY 2 (TWO) HOURS AS NEEDED FOR MIGRAINE. MAY REPEAT IN 2 HOURS IF HEADACHE PERSISTS OR RECURS.  18 tablet 4   No current facility-administered medications for this visit.   BP 138/79 (BP Location: Left Arm, Patient Position: Sitting, Cuff Size: Normal)   Pulse 77   Wt 170 lb (77.1 kg)   SpO2 96%   BMI 22.43 kg/m   PHYSICAL EXAM:  BP 138/79 (BP Location: Left Arm, Patient Position: Sitting, Cuff Size: Normal)   Pulse 77   Wt 170 lb (77.1 kg)   SpO2 96%   BMI 22.43 kg/m     Salient findings:  CN II-XII intact Bilateral cerumen impaction; after clearance, Bilateral EAC clear and TM intact with well pneumatized middle ear spaces Nose: Anterior rhinoscopy reveals left nasal septal deviation; prominent left septal vessels > right; right > left inferior turbinate hypertrophy.  Nasal endoscopy was indicated to better evaluate the nose and paranasal sinuses, given the patient's history and exam findings, and is detailed below. No lesions of oral cavity/oropharynx No obviously palpable neck masses/lymphadenopathy/thyromegaly No respiratory distress or stridor  PROCEDURE:  Prior to initiating any procedures, risks/benefits/alternatives were explained to the patient and verbal consent obtained. Diagnostic Nasal Endoscopy Pre-procedure diagnosis: Concern for epistaxis Post-procedure diagnosis: same Indication: See pre-procedure diagnosis and physical exam above Complications: None apparent EBL: 0 mL Anesthesia: Lidocaine 4% and topical decongestant was topically sprayed in each nasal cavity  Description of Procedure:  Patient was identified. A rigid 30 degree endoscope was utilized to evaluate the sinonasal cavities, mucosa, sinus ostia and turbinates and septum.  Overall, signs of mucosal inflammation are not noted.  No mucopurulence, polyps, or masses noted.   Right Middle meatus: clear Right SE Recess: clear Left MM: clear Left SE Recess: clear Photodocumentation was obtained.  CPT CODE -- 68768 - Mod 25  Procedure: Bilateral ear microscopy and cerumen removal using microscope (CPT [id number]) - Mod 25 Pre-procedure diagnosis: Cerumen impaction bilateral external ears Post-procedure diagnosis: same Indication: Bilateral cerumen impaction; given patient's otologic complaints and history as well as for improved and comprehensive examination of external ear and tympanic membrane, bilateral otologic examination using microscope was performed and impacted cerumen  removed  Procedure: Patient was placed semi-recumbent. Both ear canals were examined using the microscope with findings above. Impacted cerumen removed on left and on right using suction and currette with improvement in EAC examination and patency. Patient tolerated the procedure well.  ASSESSMENT:  48 y.o. with:  1. Epistaxis   2. Bilateral impacted cerumen    Likely due to prominent septal vessels and dry nasal cavity. We discussed humidification v/s cautery. Patient opted for humidification From cerumen standpoint, cleared. He'd like to see us  in 1 year for recheck  We've discussed issues and options today.  The risks, benefits and alternatives were discussed and questions answered.  He has elected to proceed with:  1) Mupirocin  BID x7d 2) Ayr gel BID after 2) Follow-up in 1 year with jeff -- sooner as necessary.  See below regarding exact medications prescribed this encounter including dosages and route: Meds ordered this encounter  Medications   mupirocin  ointment (BACTROBAN ) 2 %    Sig: Apply 1 Application topically 2 (two) times daily. Apply to left nostril twice daily for 7 days. Then switch to ayr gel    Dispense:  22 g    Refill:  0     Thank you for allowing me the opportunity to care for your patient. Please do not hesitate to contact me should you have any other questions.  Sincerely, Eldora Blanch, MD Otolaryngologist (ENT), [facility]  Phone: [phone] Fax: [phone]  MDM:  Level 4: 99204 Complexity/Problems addressed: mod - multiple chronic problems, one with exacerbation Data complexity: mod - independent review of notes, labs,  - Morbidity: mod  - Prescription Drug prescribed or managed: y  05/29/2024, 3:50 PM

## 2024-05-29 NOTE — Patient Instructions (Addendum)
 Mupirocin  ointment: Apply a pea sized amount twice daily just to inside of each nostril using your pinkie finger for 7 days (apply to the nostril part, not the middle part), then pinch your nose for 10 seconds, then stop Ayr gel: Apply a pea sized amount up to 2 times daily just to inside of each nostril like above, then pinch your nose for 10 seconds. Use this consistently.

## 2024-07-01 ENCOUNTER — Telehealth: Admitting: Nurse Practitioner

## 2024-07-01 DIAGNOSIS — M1000 Idiopathic gout, unspecified site: Secondary | ICD-10-CM

## 2024-07-01 MED ORDER — PREDNISONE 20 MG PO TABS: 40.0000 mg | ORAL_TABLET | Freq: Every day | ORAL | 0 refills | Status: AC

## 2024-07-01 NOTE — Progress Notes (Signed)
 E-Visit for Gout Symptoms  We are sorry that you are not feeling well. We are here to help!  Although we are sending prednisone , I do recommend you reach out to Dr Kennyth to see if he would like to check your uric acid levels and possibly put you on a daily medicine that helps prevent gout flares.   Based on what you shared with me it looks like you have a flare of your gout.  Gout is a form of arthritis. It can cause pain and swelling in the joints. At first, it tends to affect only 1 joint - most frequently the big toe. It happens in people who have too much uric acid in the blood. Uric acid is a chemical that is produced when the body breaks down certain foods. Uric acid can form sharp needle-like crystals that build up in the joints and cause pain. Uric acid crystals can also form inside the tubes that carry urine from the kidneys to the bladder. These crystals can turn into kidney stones that can cause pain and problems with the flow of urine. People with gout get sudden flares or attacks of severe pain, most often the big toe, ankle, or knee. Often the joint also turns red and swells. Usually, only 1 joint is affected, but some people have pain in more than 1 joint. Gout flares tend to happen more often during the night.  The pain from gout can be extreme. The pain and swelling are worst at the beginning of a gout flare. The symptoms then get better within a few days to weeks. It is not clear how the body turns off a gout flare.  Do not start any NEW preventative medicine until the gout has cleared completely. However, If you are already on Probenecid or Allopurinol for CHRONIC gout, you may continue taking this during an active flare up  I have prescribed Prednisone  40 mg daily for 7   HOME CARE Losing weight can help relieve gout. It's not clear that following a specific diet plan will help with gout symptoms but eating a balanced diet can help improve your overall health. It can also  help you lose weight, if you are overweight. In general, a healthy diet includes plenty of fruits, vegetables, whole grains, and low-fat dairy products (labelled "low fat", skim, 2%). Avoid sugar sweetened drinks (including sodas, tea, juice and juice blends, coffee drinks and sports drinks) Limit alcohol to 1-2 drinks of beer, spirits or wine daily these can make gout flares worse. Some people with gout also have other health problems, such as heart disease, high blood pressure, kidney disease, or obesity. If you have any of these issues, it's important to work with your doctor to manage them. This can help improve your overall health and might also help with your gout.  GET HELP RIGHT AWAY IF: Your symptoms persist after you have completed your treatment plan You develop severe diarrhea You develop abnormal sensations  You develop vomiting,   You develop weakness  You develop abdominal pain  FOLLOW UP WITH YOUR PRIMARY PROVIDER IF: If your symptoms do not improve within 10 days  MAKE SURE YOU  Understand these instructions. Will watch your condition. Will get help right away if you are not doing well or get worse.  Thank you for choosing an e-visit.  Your e-visit answers were reviewed by a board certified advanced clinical practitioner to complete your personal care plan. Depending upon the condition, your plan could have included  both over the counter or prescription medications.  Please review your pharmacy choice. Make sure the pharmacy is open so you can pick up prescription now. If there is a problem, you may contact your provider through [employer] and have the prescription routed to another pharmacy.  Your safety is important to us . If you have drug allergies check your prescription carefully.   For the next 24 hours you can use MyChart to ask questions about today's visit, request a non-urgent call back, or ask for a work or school excuse. You will get an email in the next  two days asking about your experience. I hope that your e-visit has been valuable and will speed your recovery.

## 2024-07-01 NOTE — Progress Notes (Signed)
 I have spent 5 minutes in review of e-visit questionnaire, review and updating patient chart, medical decision making and response to patient.   Claiborne Rigg, NP

## 2024-08-11 ENCOUNTER — Ambulatory Visit (INDEPENDENT_AMBULATORY_CARE_PROVIDER_SITE_OTHER): Admitting: Family Medicine

## 2024-08-11 VITALS — BP 138/87 | HR 60 | Temp 97.9°F | Ht 73.0 in | Wt 177.2 lb

## 2024-08-11 DIAGNOSIS — Z131 Encounter for screening for diabetes mellitus: Secondary | ICD-10-CM | POA: Diagnosis not present

## 2024-08-11 DIAGNOSIS — R519 Headache, unspecified: Secondary | ICD-10-CM | POA: Diagnosis not present

## 2024-08-11 DIAGNOSIS — M109 Gout, unspecified: Secondary | ICD-10-CM | POA: Diagnosis not present

## 2024-08-11 DIAGNOSIS — Z23 Encounter for immunization: Secondary | ICD-10-CM

## 2024-08-11 DIAGNOSIS — Z13220 Encounter for screening for lipoid disorders: Secondary | ICD-10-CM | POA: Diagnosis not present

## 2024-08-11 LAB — COMPREHENSIVE METABOLIC PANEL WITH GFR
ALT: 22 U/L (ref 0–53)
AST: 42 U/L — ABNORMAL HIGH (ref 0–37)
Albumin: 4.7 g/dL (ref 3.5–5.2)
Alkaline Phosphatase: 88 U/L (ref 39–117)
BUN: 11 mg/dL (ref 6–23)
CO2: 28 meq/L (ref 19–32)
Calcium: 9.7 mg/dL (ref 8.4–10.5)
Chloride: 97 meq/L (ref 96–112)
Creatinine, Ser: 0.79 mg/dL (ref 0.40–1.50)
GFR: 104.78 mL/min (ref >60.00)
Glucose, Bld: 89 mg/dL (ref 70–99)
Potassium: 4.2 meq/L (ref 3.5–5.1)
Sodium: 134 meq/L — ABNORMAL LOW (ref 135–145)
Total Bilirubin: 0.4 mg/dL (ref 0.2–1.2)
Total Protein: 7.4 g/dL (ref 6.0–8.3)

## 2024-08-11 LAB — URIC ACID: Uric Acid, Serum: 7.8 mg/dL (ref 4.0–7.8)

## 2024-08-11 LAB — CBC
HCT: 41.6 % (ref 39.0–52.0)
Hemoglobin: 14 g/dL (ref 13.0–17.0)
MCHC: 33.7 g/dL (ref 30.0–36.0)
MCV: 83 fl (ref 78.0–100.0)
Platelets: 258 K/uL (ref 150.0–400.0)
RBC: 5.01 Mil/uL (ref 4.22–5.81)
RDW: 15.5 % (ref 11.5–15.5)
WBC: 6.3 K/uL (ref 4.0–10.5)

## 2024-08-11 LAB — LIPID PANEL
Cholesterol: 192 mg/dL (ref 0–200)
HDL: 44.4 mg/dL (ref >39.00)
LDL Cholesterol: 133 mg/dL — ABNORMAL HIGH (ref 0–99)
NonHDL: 147.98
Total CHOL/HDL Ratio: 4
Triglycerides: 77 mg/dL (ref 0.0–149.0)
VLDL: 15.4 mg/dL (ref 0.0–40.0)

## 2024-08-11 LAB — TSH: TSH: 2.68 u[IU]/mL (ref 0.35–5.50)

## 2024-08-11 LAB — HEMOGLOBIN A1C: Hgb A1c MFr Bld: 6.2 % (ref 4.6–6.5)

## 2024-08-11 NOTE — Patient Instructions (Signed)
 It was very nice to see you today!  VISIT SUMMARY: During your visit, we discussed the management of your recurrent gout flares and elevated blood pressure. We reviewed your recent gout episodes and blood pressure readings, and we have outlined a plan to address these issues.  YOUR PLAN: GOUT WITH RECURRENT FLARES: You have experienced recurrent gout flares in your knees and foot, with three episodes this year. -We will order a uric acid level test to monitor your uric acid levels. -Consider starting allopurinol based on your uric acid levels to maintain levels below 6.5 mg/dL. -Use prednisone  for acute gout flares as it has been effective for you. -Monitor your uric acid levels regularly to keep them below 6.5 mg/dL.  ELEVATED BLOOD PRESSURE: Your blood pressure readings have been higher than usual this year, with systolic readings in the 130s. -We will order blood work to check your kidney function, liver function, sodium, cholesterol, and blood sugar. -Continue to monitor your blood pressure readings at home. -There is no immediate need for antihypertensive medication at this time.  Return if symptoms worsen or fail to improve.   Take care, Dr Kennyth  PLEASE NOTE:  If you had any lab tests, please let us  know if you have not heard back within a few days. You may see your results on mychart before we have a chance to review them but we will give you a call once they are reviewed by us .   If we ordered any referrals today, please let us  know if you have not heard from their office within the next week.   If you had any urgent prescriptions sent in today, please check with the pharmacy within an hour of our visit to make sure the prescription was transmitted appropriately.   Please try these tips to maintain a healthy lifestyle:  Eat at least 3 REAL meals and 1-2 snacks per day.  Aim for no more than 5 hours between eating.  If you eat breakfast, please do so within one hour of  getting up.   Each meal should contain half fruits/vegetables, one quarter protein, and one quarter carbs (no bigger than a computer mouse)  Cut down on sweet beverages. This includes juice, soda, and sweet tea.   Drink at least 1 glass of water with each meal and aim for at least 8 glasses per day  Exercise at least 150 minutes every week.

## 2024-08-11 NOTE — Assessment & Plan Note (Signed)
 Patient with 3 flareups over the last few months.  Patient reports that his gout was diagnosed many years ago at [facility] however never had blood work done to confirm this.  Given his recent flareups he is now interested in trying urate lowering medication for prevention.  He has been working diligently on decreasing purines in his diet.  We will check uric acid level today.  If elevated will start allopurinol.  If uric acid level is normal we will need to look for other potential causes for his recurrent joint pain and inflammation.

## 2024-08-11 NOTE — Progress Notes (Signed)
   [name] is a 48 y.o. male who presents today for an office visit.  Assessment/Plan:  New/Acute Problems: Epistaxis Resolved.  He will let us  know if he has any recurrence  Chronic Problems Addressed Today: Gout Patient with 3 flareups over the last few months.  Patient reports that his gout was diagnosed many years ago at [facility] however never had blood work done to confirm this.  Given his recent flareups he is now interested in trying urate lowering medication for prevention.  He has been working diligently on decreasing purines in his diet.  We will check uric acid level today.  If elevated will start allopurinol.  If uric acid level is normal we will need to look for other potential causes for his recurrent joint pain and inflammation.  Chronic nonintractable headache Stable on Imitrex  as needed.  Does not need refill today.  Flu shot given today.    Subjective:  HPI:  See assessment / plan for status of chronic conditions.   Discussed the use of AI scribe software for clinical note transcription with the patient, who gave verbal consent to proceed.  History of Present Illness Nicholas Harmon is a 48 year old male with hypertension and gout who presents for management of recurrent gout flares and elevated blood pressure.  He has experienced three gout flares this year, which is more frequent than in previous years. The flares occurred in [place], June, and August, affecting his knees and foot, areas not previously involved. Prednisone , prescribed in December, was used to manage the flare in [place] and was effective. He is currently much better, though not completely symptom-free, and has resumed walking. He has been working on dietary changes and reports no family history of gout. He was initially diagnosed by a physician in [place] without any blood tests.  He has a history of elevated blood pressure, which has been higher than usual this year, with readings in the 130s  systolic range, compared to his usual 110s. He experienced severe epistaxis earlier this year, requiring ER intervention, where his blood pressure was recorded at 140 systolic. The epistaxis was managed with Afrin and packing, and an ENT noted prominent blood vessels in his nose but did not perform any procedures as the bleeding had resolved by the time of the visit. He has since acquired a blood pressure machine and continues to monitor his readings at home.  He is currently prescribed Imitrex  and has refills available. He was also prescribed colchicine  during a virtual visit but has not used it, preferring prednisone  for flare management due to concerns about side effects. He has not undergone any blood tests for uric acid levels or other related diagnostics.         Objective:  Physical Exam: BP 138/87   Pulse 60   Temp 97.9 F (36.6 C)   Ht 6' 1 (1.854 m)   Wt 177 lb 3.2 oz (80.4 kg)   SpO2 97%   BMI 23.38 kg/m   Gen: No acute distress, resting comfortably CV: Regular rate and rhythm with no murmurs appreciated Pulm: Normal work of breathing, clear to auscultation bilaterally with no crackles, wheezes, or rhonchi Neuro: Grossly normal, moves all extremities Psych: Normal affect and thought content      Sava Proby M. Kennyth, MD 08/11/2024 1:21 PM

## 2024-08-11 NOTE — Assessment & Plan Note (Signed)
 Stable on Imitrex  as needed.  Does not need refill today.

## 2024-08-14 ENCOUNTER — Ambulatory Visit: Payer: Self-pay | Admitting: Family Medicine

## 2024-08-14 DIAGNOSIS — E785 Hyperlipidemia, unspecified: Secondary | ICD-10-CM | POA: Insufficient documentation

## 2024-08-14 DIAGNOSIS — R7401 Elevation of levels of liver transaminase levels: Secondary | ICD-10-CM

## 2024-08-14 DIAGNOSIS — R7303 Prediabetes: Secondary | ICD-10-CM | POA: Insufficient documentation

## 2024-08-14 DIAGNOSIS — E790 Hyperuricemia without signs of inflammatory arthritis and tophaceous disease: Secondary | ICD-10-CM

## 2024-08-14 NOTE — Progress Notes (Signed)
 Uric acid level 7.8.  Recommend starting allopurinol  100 mg daily.  We should recheck again in 1 month.  The goal is to get this less than 6.  Please place future order for uric acid.  His LDL is mildly elevated.  No need to start meds for this but he should work on diet and exercise and we can recheck in a year.  His A1c was 6.2.  This is in the prediabetic range.  As above do not need to start meds for this but he should work on diet and exercise and we can recheck in a year.  His sodium was mildly decreased and his AST which is one of his liver markers was mildly elevated.  We should recheck c-Met in 1 month as well.  Please place future order for this.  All of his other labs are at goal.

## 2024-08-15 MED ORDER — ALLOPURINOL 100 MG PO TABS: 100.0000 mg | ORAL_TABLET | Freq: Every day | ORAL | 6 refills | Status: AC

## 2024-09-12 NOTE — Telephone Encounter (Signed)
 Lab appt schedule for 09/19/2024

## 2024-09-14 ENCOUNTER — Telehealth: Payer: Self-pay | Admitting: *Deleted

## 2024-09-18 ENCOUNTER — Ambulatory Visit (INDEPENDENT_AMBULATORY_CARE_PROVIDER_SITE_OTHER): Admitting: Family Medicine

## 2024-09-18 ENCOUNTER — Other Ambulatory Visit

## 2024-09-18 VITALS — BP 126/86 | HR 64 | Temp 98.6°F | Resp 14 | Ht 73.0 in | Wt 178.4 lb

## 2024-09-18 DIAGNOSIS — R7303 Prediabetes: Secondary | ICD-10-CM

## 2024-09-18 DIAGNOSIS — M109 Gout, unspecified: Secondary | ICD-10-CM | POA: Diagnosis not present

## 2024-09-18 NOTE — Patient Instructions (Addendum)
 It was very nice to see you today!  VISIT SUMMARY: You came in for a follow-up on your gout treatment with allopurinol . We discussed your mild side effects, ongoing gout symptoms, low sodium levels, and concerns about prediabetes.  YOUR PLAN: GOUT AND HYPERURICEMIA: You are experiencing mild side effects from allopurinol , but your gout symptoms have improved. -Continue taking allopurinol  as prescribed. -We will do a blood test to check your complete blood count, metabolic panel, and uric acid level to monitor your response to allopurinol  and check for any adverse effects.  PREDIABETES: Your A1c level is at 6.2%, indicating prediabetes. -Continue your healthy diet and regular exercise to prevent the progression to diabetes.  Return if symptoms worsen or fail to improve.   Take care, Dr Kennyth  PLEASE NOTE:  If you had any lab tests, please let us  know if you have not heard back within a few days. You may see your results on mychart before we have a chance to review them but we will give you a call once they are reviewed by us .   If we ordered any referrals today, please let us  know if you have not heard from their office within the next week.   If you had any urgent prescriptions sent in today, please check with the pharmacy within an hour of our visit to make sure the prescription was transmitted appropriately.   Please try these tips to maintain a healthy lifestyle:  Eat at least 3 REAL meals and 1-2 snacks per day.  Aim for no more than 5 hours between eating.  If you eat breakfast, please do so within one hour of getting up.   Each meal should contain half fruits/vegetables, one quarter protein, and one quarter carbs (no bigger than a computer mouse)  Cut down on sweet beverages. This includes juice, soda, and sweet tea.   Drink at least 1 glass of water with each meal and aim for at least 8 glasses per day  Exercise at least 150 minutes every week.

## 2024-09-18 NOTE — Assessment & Plan Note (Signed)
 Last A1c 6.2.  He is working on lifestyle modifications.  We can recheck next year.

## 2024-09-18 NOTE — Assessment & Plan Note (Signed)
 Patient has been on allopurinol  for the last several weeks.  He is having some mild side effects with these are all manageable.  Will recheck labs today continue allopurinol  100 mg daily.  Dissipate that his side effects will continue to improve over the next several weeks though he will let us  know if he has any issues with this.

## 2024-09-18 NOTE — Progress Notes (Signed)
   Nicholas Harmon is a 48 y.o. male who presents today for an office visit.  Assessment/Plan:  Chronic Problems Addressed Today: Gout Patient has been on allopurinol  for the last several weeks.  He is having some mild side effects with these are all manageable.  Will recheck labs today continue allopurinol  100 mg daily.  Dissipate that his side effects will continue to improve over the next several weeks though he will let us  know if he has any issues with this.  Prediabetes Last A1c 6.2.  He is working on lifestyle modifications.  We can recheck next year.     Subjective:  HPI:  See assessment / plan for status of chronic conditions.   Discussed the use of AI scribe software for clinical note transcription with the patient, who gave verbal consent to proceed.  History of Present Illness Nicholas Harmon is a 48 year old male with gout who presents for follow-up on allopurinol  treatment.  He experiences sporadic pruritus all over his body and in his eyes, described as a gritty sensation, which began after starting allopurinol . These symptoms occur intermittently, including this morning. He also reports two small oral ulcers that resolved within 24 hours and occasional mild pharyngitis, all occurring after starting the medication.  He describes only mild discomfort remaining in his right foot, which he finds much less bothersome.  He mentions a history of hyponatremia in his last three lab results, with a sodium level of 134, but he has not experienced any symptoms related to this.  He is concerned about prediabetic numbers despite maintaining a healthy diet and regular exercise. He is unaware of any family history of diabetes.         Objective:  Physical Exam: BP 126/86   Pulse 64   Temp 98.6 F (37 C) (Temporal)   Resp 14   Ht 6' 1 (1.854 m)   Wt 178 lb 6.4 oz (80.9 kg)   SpO2 98%   BMI 23.54 kg/m   Gen: No acute distress, resting comfortably CV: Regular rate and rhythm  with no murmurs appreciated Pulm: Normal work of breathing, clear to auscultation bilaterally with no crackles, wheezes, or rhonchi Neuro: Grossly normal, moves all extremities Psych: Normal affect and thought content      Michell Giuliano M. Kennyth, MD 09/18/2024 2:25 PM

## 2024-09-19 ENCOUNTER — Other Ambulatory Visit

## 2024-09-19 ENCOUNTER — Ambulatory Visit: Payer: Self-pay | Admitting: Family Medicine

## 2024-09-19 LAB — CBC WITH DIFFERENTIAL/PLATELET
Basophils Absolute: 0.1 K/uL (ref 0.0–0.1)
Basophils Relative: 0.9 % (ref 0.0–3.0)
Eosinophils Absolute: 0 K/uL (ref 0.0–0.7)
Eosinophils Relative: 0.6 % (ref 0.0–5.0)
HCT: 41.6 % (ref 39.0–52.0)
Hemoglobin: 13.9 g/dL (ref 13.0–17.0)
Lymphocytes Relative: 19.7 % (ref 12.0–46.0)
Lymphs Abs: 1.3 K/uL (ref 0.7–4.0)
MCHC: 33.4 g/dL (ref 30.0–36.0)
MCV: 86.8 fl (ref 78.0–100.0)
Monocytes Absolute: 0.5 K/uL (ref 0.1–1.0)
Monocytes Relative: 7.9 % (ref 3.0–12.0)
Neutro Abs: 4.8 K/uL (ref 1.4–7.7)
Neutrophils Relative %: 70.9 % (ref 43.0–77.0)
Platelets: 263 K/uL (ref 150.0–400.0)
RBC: 4.8 Mil/uL (ref 4.22–5.81)
RDW: 15.9 % — ABNORMAL HIGH (ref 11.5–15.5)
WBC: 6.7 K/uL (ref 4.0–10.5)

## 2024-09-19 LAB — COMPREHENSIVE METABOLIC PANEL WITH GFR
ALT: 20 U/L (ref 0–53)
AST: 40 U/L — ABNORMAL HIGH (ref 0–37)
Albumin: 4.8 g/dL (ref 3.5–5.2)
Alkaline Phosphatase: 106 U/L (ref 39–117)
BUN: 12 mg/dL (ref 6–23)
CO2: 30 meq/L (ref 19–32)
Calcium: 9.5 mg/dL (ref 8.4–10.5)
Chloride: 97 meq/L (ref 96–112)
Creatinine, Ser: 0.79 mg/dL (ref 0.40–1.50)
GFR: 104.7 mL/min (ref >60.00)
Glucose, Bld: 103 mg/dL — ABNORMAL HIGH (ref 70–99)
Potassium: 4.9 meq/L (ref 3.5–5.1)
Sodium: 134 meq/L — ABNORMAL LOW (ref 135–145)
Total Bilirubin: 0.3 mg/dL (ref 0.2–1.2)
Total Protein: 7.1 g/dL (ref 6.0–8.3)

## 2024-09-19 LAB — URIC ACID: Uric Acid, Serum: 5.7 mg/dL (ref 4.0–7.8)

## 2024-09-19 NOTE — Progress Notes (Signed)
 His uric acid level is now at goal.  His other labs are all stable compared to last month.  He can continue with allopurinol  100 mg daily and we can recheck everything when he comes back in for his physical next year.  Do not need to do any further testing at this point.

## 2024-09-20 NOTE — Telephone Encounter (Signed)
 SABRA

## 2024-09-22 NOTE — Telephone Encounter (Signed)
 Copied from CRM #[id number]. Topic: Appointments - Scheduling Inquiry for Clinic >> Sep 22, 2024  9:09 AM Jasmin G wrote: Reason for CRM: Pt called due to receiving a message stating that he had a missed appt recently and was inquiring if that appt would be charged as he doesn't remember making it. Call pt back at [phone] to discuss.

## 2024-09-27 ENCOUNTER — Other Ambulatory Visit: Payer: Self-pay | Admitting: Family Medicine

## 2024-12-20 ENCOUNTER — Encounter: Payer: Self-pay | Admitting: Family Medicine

## 2024-12-20 ENCOUNTER — Ambulatory Visit (INDEPENDENT_AMBULATORY_CARE_PROVIDER_SITE_OTHER): Admitting: Family Medicine

## 2024-12-20 VITALS — BP 118/78 | HR 58 | Temp 97.4°F | Ht 73.0 in | Wt 177.8 lb

## 2024-12-20 DIAGNOSIS — E785 Hyperlipidemia, unspecified: Secondary | ICD-10-CM

## 2024-12-20 DIAGNOSIS — Z114 Encounter for screening for human immunodeficiency virus [HIV]: Secondary | ICD-10-CM | POA: Diagnosis not present

## 2024-12-20 DIAGNOSIS — R519 Headache, unspecified: Secondary | ICD-10-CM | POA: Diagnosis not present

## 2024-12-20 DIAGNOSIS — Z131 Encounter for screening for diabetes mellitus: Secondary | ICD-10-CM | POA: Diagnosis not present

## 2024-12-20 DIAGNOSIS — Z0001 Encounter for general adult medical examination with abnormal findings: Secondary | ICD-10-CM

## 2024-12-20 DIAGNOSIS — M109 Gout, unspecified: Secondary | ICD-10-CM | POA: Diagnosis not present

## 2024-12-20 DIAGNOSIS — R7303 Prediabetes: Secondary | ICD-10-CM

## 2024-12-20 DIAGNOSIS — Z1211 Encounter for screening for malignant neoplasm of colon: Secondary | ICD-10-CM | POA: Diagnosis not present

## 2024-12-20 DIAGNOSIS — G8929 Other chronic pain: Secondary | ICD-10-CM | POA: Diagnosis not present

## 2024-12-20 DIAGNOSIS — Z13220 Encounter for screening for lipoid disorders: Secondary | ICD-10-CM

## 2024-12-20 DIAGNOSIS — Z1159 Encounter for screening for other viral diseases: Secondary | ICD-10-CM | POA: Diagnosis not present

## 2024-12-20 LAB — LIPID PANEL
Cholesterol: 161 mg/dL (ref 28–200)
HDL: 36.7 mg/dL — ABNORMAL LOW
LDL Cholesterol: 105 mg/dL — ABNORMAL HIGH (ref 10–99)
NonHDL: 124.13
Total CHOL/HDL Ratio: 4
Triglycerides: 98 mg/dL (ref 10.0–149.0)
VLDL: 19.6 mg/dL (ref 0.0–40.0)

## 2024-12-20 LAB — COMPREHENSIVE METABOLIC PANEL WITH GFR
ALT: 24 U/L (ref 3–53)
AST: 48 U/L — ABNORMAL HIGH (ref 5–37)
Albumin: 4.4 g/dL (ref 3.5–5.2)
Alkaline Phosphatase: 89 U/L (ref 39–117)
BUN: 12 mg/dL (ref 6–23)
CO2: 31 meq/L (ref 19–32)
Calcium: 9.3 mg/dL (ref 8.4–10.5)
Chloride: 100 meq/L (ref 96–112)
Creatinine, Ser: 0.8 mg/dL (ref 0.40–1.50)
GFR: 104.12 mL/min
Glucose, Bld: 94 mg/dL (ref 70–99)
Potassium: 4.4 meq/L (ref 3.5–5.1)
Sodium: 135 meq/L (ref 135–145)
Total Bilirubin: 0.4 mg/dL (ref 0.2–1.2)
Total Protein: 6.9 g/dL (ref 6.0–8.3)

## 2024-12-20 LAB — CBC
HCT: 40.3 % (ref 39.0–52.0)
Hemoglobin: 13.4 g/dL (ref 13.0–17.0)
MCHC: 33.3 g/dL (ref 30.0–36.0)
MCV: 86.2 fl (ref 78.0–100.0)
Platelets: 235 10*3/uL (ref 150.0–400.0)
RBC: 4.68 Mil/uL (ref 4.22–5.81)
RDW: 15.2 % (ref 11.5–15.5)
WBC: 4.1 10*3/uL (ref 4.0–10.5)

## 2024-12-20 LAB — URIC ACID: Uric Acid, Serum: 7.1 mg/dL (ref 4.0–7.8)

## 2024-12-20 LAB — HEMOGLOBIN A1C: Hgb A1c MFr Bld: 5.8 % (ref 4.6–6.5)

## 2024-12-20 NOTE — Assessment & Plan Note (Signed)
 Doing well on allopurinol  100 mg daily.  No longer having any significant side effects.  Will check uric acid level today.

## 2024-12-20 NOTE — Assessment & Plan Note (Signed)
 Check blood's with labs.  He is doing a great job with lifestyle interventions.

## 2024-12-20 NOTE — Patient Instructions (Addendum)
 It was very nice to see you today!  VISIT SUMMARY: During your visit, we reviewed your gout management, addressed your shoulder pain, and discussed earwax buildup. We also scheduled a colonoscopy as part of your routine health maintenance.  YOUR PLAN: GOUT: Your gout is well-managed with allopurinol , and you have not had any severe attacks recently. -Continue taking allopurinol  as prescribed. -Maintain your low-purine diet. -We have ordered blood work to check your uric acid levels.  BICIPITAL TENDINITIS OF Right SHOULDER: Your chronic right shoulder pain is likely due to bicipital tendinitis, possibly with mild arthritis. -Avoid heavy lifting and exercises that cause pain. -Follow the stretches and exercises provided in the handout. -If there is no improvement in a few weeks, we may consider x-rays or physical therapy.  IMPACTED CERUMEN: You have a history of earwax buildup, but there is no current blockage. -Use over-the-counter Dabrox or hydrogen peroxide for earwax removal. -Avoid using Q-tips to prevent further impaction.  GENERAL HEALTH MAINTENANCE: You are up to date on your flu vaccination, but a colonoscopy is due. -We have scheduled a colonoscopy for you. -Continue with routine health maintenance screenings.  Return in about 1 year (around 12/20/2025) for Annual Physical.   Take care, Dr Kennyth  PLEASE NOTE:  If you had any lab tests, please let us  know if you have not heard back within a few days. You may see your results on mychart before we have a chance to review them but we will give you a call once they are reviewed by us .   If we ordered any referrals today, please let us  know if you have not heard from their office within the next week.   If you had any urgent prescriptions sent in today, please check with the pharmacy within an hour of our visit to make sure the prescription was transmitted appropriately.   Please try these tips to maintain a healthy  lifestyle:  Eat at least 3 REAL meals and 1-2 snacks per day.  Aim for no more than 5 hours between eating.  If you eat breakfast, please do so within one hour of getting up.   Each meal should contain half fruits/vegetables, one quarter protein, and one quarter carbs (no bigger than a computer mouse)  Cut down on sweet beverages. This includes juice, soda, and sweet tea.   Drink at least 1 glass of water with each meal and aim for at least 8 glasses per day  Exercise at least 150 minutes every week.    Preventive Care 42-72 Years Old, Male Preventive care refers to lifestyle choices and visits with your health care provider that can promote health and wellness. Preventive care visits are also called wellness exams. What can I expect for my preventive care visit? Counseling During your preventive care visit, your health care provider may ask about your: Medical history, including: Past medical problems. Family medical history. Current health, including: Emotional well-being. Home life and relationship well-being. Sexual activity. Lifestyle, including: Alcohol, nicotine or tobacco, and drug use. Access to firearms. Diet, exercise, and sleep habits. Safety issues such as seatbelt and bike helmet use. Sunscreen use. Work and work astronomer. Physical exam Your health care provider will check your: Height and weight. These may be used to calculate your BMI (body mass index). BMI is a measurement that tells if you are at a healthy weight. Waist circumference. This measures the distance around your waistline. This measurement also tells if you are at a healthy weight and may help  predict your risk of certain diseases, such as type 2 diabetes and high blood pressure. Heart rate and blood pressure. Body temperature. Skin for abnormal spots. What immunizations do I need?  Vaccines are usually given at various ages, according to a schedule. Your health care provider will recommend  vaccines for you based on your age, medical history, and lifestyle or other factors, such as travel or where you work. What tests do I need? Screening Your health care provider may recommend screening tests for certain conditions. This may include: Lipid and cholesterol levels. Diabetes screening. This is done by checking your blood sugar (glucose) after you have not eaten for a while (fasting). Hepatitis B test. Hepatitis C test. HIV (human immunodeficiency virus) test. STI (sexually transmitted infection) testing, if you are at risk. Lung cancer screening. Prostate cancer screening. Colorectal cancer screening. Talk with your health care provider about your test results, treatment options, and if necessary, the need for more tests. Follow these instructions at home: Eating and drinking  Eat a diet that includes fresh fruits and vegetables, whole grains, lean protein, and low-fat dairy products. Take vitamin and mineral supplements as recommended by your health care provider. Do not drink alcohol if your health care provider tells you not to drink. If you drink alcohol: Limit how much you have to 0-2 drinks a day. Know how much alcohol is in your drink. In the [place] one drink equals one 12 oz bottle of beer (355 mL), one 5 oz glass of wine (148 mL), or one 1 oz glass of hard liquor (44 mL). Lifestyle Brush your teeth every morning and night with fluoride toothpaste. Floss one time each day. Exercise for at least 30 minutes 5 or more days each week. Do not use any products that contain nicotine or tobacco. These products include cigarettes, chewing tobacco, and vaping devices, such as e-cigarettes. If you need help quitting, ask your health care provider. Do not use drugs. If you are sexually active, practice safe sex. Use a condom or other form of protection to prevent STIs. Take aspirin only as told by your health care provider. Make sure that you understand how much to take and what  form to take. Work with your health care provider to find out whether it is safe and beneficial for you to take aspirin daily. Find healthy ways to manage stress, such as: Meditation, yoga, or listening to music. Journaling. Talking to a trusted person. Spending time with friends and family. Minimize exposure to UV radiation to reduce your risk of skin cancer. Safety Always wear your seat belt while driving or riding in a vehicle. Do not drive: If you have been drinking alcohol. Do not ride with someone who has been drinking. When you are tired or distracted. While texting. If you have been using any mind-altering substances or drugs. Wear a helmet and other protective equipment during sports activities. If you have firearms in your house, make sure you follow all gun safety procedures. What's next? Go to your health care provider once a year for an annual wellness visit. Ask your health care provider how often you should have your eyes and teeth checked. Stay up to date on all vaccines. This information is not intended to replace advice given to you by your health care provider. Make sure you discuss any questions you have with your health care provider. Document Revised: 05/07/2021 Document Reviewed: 05/07/2021 Elsevier Patient Education  2024 [employer].

## 2024-12-20 NOTE — Assessment & Plan Note (Signed)
 Check A1c with labs.

## 2024-12-20 NOTE — Progress Notes (Signed)
 "  Chief Complaint:  Nicholas Harmon is a 49 y.o. male who presents today for his annual comprehensive physical exam.    Assessment/Plan:  New/Acute Problems: Right Shoulder Pain  Patient with 1 year or so of intermittent right shoulder pain.  Exam today overall reassuring without any signs of rotator cuff tendinopathy.  Discussed differential including biceps tendinitis or pec strain.  Also likely has some underlying osteoarthritis which is contributing as well.  We discussed management options.  Discussed home exercise program and handout was given.  We also did discuss referral to PT or sports medicine as well as imaging however he would like to hold off on this for now.  He will follow-up with us  in a few weeks via MyChart.  If no improvement we can refer or get x-rays at that time.  Chronic Problems Addressed Today: Gout Doing well on allopurinol  100 mg daily.  No longer having any significant side effects.  Will check uric acid level today.  Prediabetes Check A1c with labs.  Dyslipidemia Check blood's with labs.  He is doing a great job with lifestyle interventions.  Chronic nonintractable headache Stable on Imitrex  as needed.  Preventative Healthcare: Check labs.  Will refer for colonoscopy.  Up-to-date on vaccines.  Patient Counseling(The following topics were reviewed and/or handout was given):  -Nutrition: Stressed importance of moderation in sodium/caffeine intake, saturated fat and cholesterol, caloric balance, sufficient intake of fresh fruits, vegetables, and fiber.  -Stressed the importance of regular exercise.   -Substance Abuse: Discussed cessation/primary prevention of tobacco, alcohol, or other drug use; driving or other dangerous activities under the influence; availability of treatment for abuse.   -Injury prevention: Discussed safety belts, safety helmets, smoke detector, smoking near bedding or upholstery.   -Sexuality: Discussed sexually transmitted diseases,  partner selection, use of condoms, avoidance of unintended pregnancy and contraceptive alternatives.   -Dental health: Discussed importance of regular tooth brushing, flossing, and dental visits.  -Health maintenance and immunizations reviewed. Please refer to Health maintenance section.  Return to care in 1 year for next preventative visit.     Subjective:  HPI:  He has no acute complaints today. Patient is here today for his annual physical.  See assessment / plan for status of chronic conditions.  Discussed the use of AI scribe software for clinical note transcription with the patient, who gave verbal consent to proceed.  History of Present Illness Nicholas Harmon is a 49 year old male who presents for an annual physical exam and follow-up on gout management.  He is adjusting well to allopurinol , initially experiencing side effects such as a 'grainy' feeling and minor flares in unusual areas like the elbow, which have since subsided. He has not experienced any severe gout attacks recently, marking a significant improvement compared to last year when he had frequent attacks. He maintains a strict diet to manage his gout, consuming oatmeal with chia seeds and blueberries for breakfast, a salad with avocado for lunch, mixed nuts for a snack, and roasted sweet potato with broccoli for dinner. He occasionally eats cherries and nonfat yogurt, believing cherries help with uric acid levels. He is careful about purine intake and follows a routine diet.  Regarding physical activity, he has shifted from heavy weightlifting to lighter weights and long walks due to a shoulder injury sustained about a year ago while doing a kettlebell clean. The injury causes intermittent pain, especially with overhead motions and bench pressing. He has been managing it by resting and gradually  reintroducing light exercises.  He also mentions a history of earwax buildup, which he manages with periodic flushing.  His son  recently had the flu, for which he was given Tamiflu but experienced nausea, leading to discontinuation of the medication. His son recovered well without further complications.      12/20/2024    9:03 AM  Depression screen PHQ 2/9  Decreased Interest 0  Down, Depressed, Hopeless 0  PHQ - 2 Score 0    Health Maintenance Due  Topic Date Due   HIV Screening  Never done   Hepatitis C Screening  Never done     ROS: Per HPI, otherwise a complete review of systems was negative.   PMH:  The following were reviewed and entered/updated in epic: Past Medical History:  Diagnosis Date   Chronic headaches    Patient Active Problem List   Diagnosis Date Noted   Dyslipidemia 08/14/2024   Prediabetes 08/14/2024   Chronic nonintractable headache 11/03/2018   Gout 09/19/2018   History reviewed. No pertinent surgical history.  Family History  Problem Relation Age of Onset   Hypertension Mother    Ovarian cancer Mother    Breast cancer Maternal Grandmother    Cancer Maternal Grandmother    Prostate cancer Father    Heart disease Maternal Grandfather    COPD Paternal Grandmother    COPD Paternal Grandfather    Colon cancer Neg Hx     Medications- reviewed and updated Current Outpatient Medications  Medication Sig Dispense Refill   allopurinol  (ZYLOPRIM ) 100 MG tablet Take 1 tablet (100 mg total) by mouth daily. 30 tablet 6   aspirin-acetaminophen-caffeine (EXCEDRIN MIGRAINE) 250-250-65 MG tablet Take by mouth every 6 (six) hours as needed for headache.     Multiple Vitamin (MULTIVITAMIN) tablet Take 1 tablet by mouth daily.     mupirocin  ointment (BACTROBAN ) 2 % Apply 1 Application topically 2 (two) times daily. Apply to left nostril twice daily for 7 days. Then switch to ayr gel 22 g 0   SUMAtriptan  (IMITREX ) 50 MG tablet TAKE 1 TABLET (50 MG TOTAL) BY MOUTH EVERY 2 (TWO) HOURS AS NEEDED FOR MIGRAINE. MAY REPEAT IN 2 HOURS IF HEADACHE PERSISTS OR RECURS. 18 tablet 4   No current  facility-administered medications for this visit.    Allergies-reviewed and updated Allergies[1]  Social History   Socioeconomic History   Marital status: Married    Spouse name: Not on file   Number of children: Not on file   Years of education: Not on file   Highest education level: Master's degree (e.g., MA, MS, MEng, MEd, MSW, MBA)  Occupational History   Not on file  Tobacco Use   Smoking status: Never   Smokeless tobacco: Never  Vaping Use   Vaping status: Never Used  Substance and Sexual Activity   Alcohol use: Yes   Drug use: Never   Sexual activity: Yes  Other Topics Concern   Not on file  Social History Narrative   Not on file   Social Drivers of Health   Tobacco Use: Low Risk (12/20/2024)   Patient History    Smoking Tobacco Use: Never    Smokeless Tobacco Use: Never    Passive Exposure: Not on file  Financial Resource Strain: Low Risk (09/18/2024)   Overall Financial Resource Strain (CARDIA)    Difficulty of Paying Living Expenses: Not very hard  Food Insecurity: No Food Insecurity (09/18/2024)   Epic    Worried About Programme Researcher, Broadcasting/film/video in  the Last Year: Never true    Ran Out of Food in the Last Year: Never true  Transportation Needs: No Transportation Needs (09/18/2024)   Epic    Lack of Transportation (Medical): No    Lack of Transportation (Non-Medical): No  Physical Activity: Sufficiently Active (09/18/2024)   Exercise Vital Sign    Days of Exercise per Week: 4 days    Minutes of Exercise per Session: 50 min  Stress: No Stress Concern Present (09/18/2024)   [employer] of Occupational Health - Occupational Stress Questionnaire    Feeling of Stress: Only a little  Social Connections: Moderately Integrated (09/18/2024)   Social Connection and Isolation Panel    Frequency of Communication with Friends and Family: Twice a week    Frequency of Social Gatherings with Friends and Family: Once a week    Attends Religious Services: 1 to 4 times  per year    Active Member of [employer] or Organizations: No    Attends Engineer, Structural: Not on file    Marital Status: Married  Depression (PHQ2-9): Low Risk (12/20/2024)   Depression (PHQ2-9)    PHQ-2 Score: 0  Alcohol Screen: Not on file  Housing: Low Risk (09/18/2024)   Epic    Unable to Pay for Housing in the Last Year: No    Number of Times Moved in the Last Year: 0    Homeless in the Last Year: No  Utilities: Not on file  Health Literacy: Not on file        Objective:  Physical Exam: BP 118/78   Pulse (!) 58   Temp (!) 97.4 F (36.3 C) (Temporal)   Ht 6' 1 (1.854 m)   Wt 177 lb 12.8 oz (80.6 kg)   SpO2 98%   BMI 23.46 kg/m   Body mass index is 23.46 kg/m. Wt Readings from Last 3 Encounters:  12/20/24 177 lb 12.8 oz (80.6 kg)  09/18/24 178 lb 6.4 oz (80.9 kg)  08/11/24 177 lb 3.2 oz (80.4 kg)   Gen: NAD, resting comfortably HEENT: TMs normal bilaterally. OP clear. No thyromegaly noted.  CV: RRR with no murmurs appreciated Pulm: NWOB, CTAB with no crackles, wheezes, or rhonchi GI: Normal bowel sounds present. Soft, Nontender, Nondistended. MSK: no edema, cyanosis, or clubbing noted - Right Shoulder: No deformities.  Tenderness to palpation along the anterior aspect.  No pain with resisted supraspinatus testing.  No pain with internal or external rotation.  Neer and Hawking test negative.  Neurovascular intact distally. Skin: warm, dry Neuro: CN2-12 grossly intact. Strength 5/5 in upper and lower extremities. Reflexes symmetric and intact bilaterally.  Psych: Normal affect and thought content     Cambree Hendrix M. Kennyth, MD 12/20/2024 9:40 AM     [1] No Known Allergies  "

## 2024-12-20 NOTE — Assessment & Plan Note (Signed)
"   Stable on Imitrex  as needed  "

## 2024-12-21 LAB — HIV ANTIBODY (ROUTINE TESTING W REFLEX)
HIV 1&2 Ab, 4th Generation: NONREACTIVE
HIV FINAL INTERPRETATION: NEGATIVE

## 2024-12-21 LAB — HEPATITIS C ANTIBODY: Hepatitis C Ab: NONREACTIVE

## 2024-12-21 LAB — TSH: TSH: 3.79 u[IU]/mL (ref 0.35–5.50)

## 2024-12-22 ENCOUNTER — Ambulatory Visit: Payer: Self-pay | Admitting: Family Medicine

## 2024-12-22 NOTE — Progress Notes (Signed)
 His LDL is 105.  This is mildly elevated but better than last year.  His A1c is also slightly better than last year at 5.8.  Do not need to make any changes to treatment plan but he should continue to work on diet and exercise and we can recheck this again in a year or so.  One of his liver numbers was mildly elevated but similar to the values over the last few years.  We can continue to monitor for now however he should cut down or decrease alcohol intake if he is taking any and we can recheck at next office visit.

## 2025-12-21 ENCOUNTER — Encounter: Admitting: Family Medicine
